# Patient Record
Sex: Female | Born: 1980 | Race: Black or African American | Hispanic: No | Marital: Single | Smoking: Current some day smoker
Health system: Southern US, Community
[De-identification: ages and names within clinical notes are randomized; demographics above are authoritative.]

## PROBLEM LIST (undated history)

## (undated) DIAGNOSIS — I82409 Acute embolism and thrombosis of unspecified deep veins of unspecified lower extremity: Secondary | ICD-10-CM

## (undated) DIAGNOSIS — N946 Dysmenorrhea, unspecified: Secondary | ICD-10-CM

## (undated) HISTORY — DX: Dysmenorrhea, unspecified: N94.6

---

## 2003-08-05 ENCOUNTER — Emergency Department (HOSPITAL_COMMUNITY): Admission: EM | Admit: 2003-08-05 | Discharge: 2003-08-06 | Payer: Self-pay | Admitting: *Deleted

## 2004-09-01 ENCOUNTER — Other Ambulatory Visit: Admission: RE | Admit: 2004-09-01 | Discharge: 2004-09-01 | Payer: Self-pay | Admitting: Obstetrics and Gynecology

## 2005-09-27 ENCOUNTER — Emergency Department (HOSPITAL_COMMUNITY): Admission: EM | Admit: 2005-09-27 | Discharge: 2005-09-27 | Payer: Self-pay | Admitting: Family Medicine

## 2006-03-24 ENCOUNTER — Other Ambulatory Visit: Admission: RE | Admit: 2006-03-24 | Discharge: 2006-03-24 | Payer: Self-pay | Admitting: Obstetrics & Gynecology

## 2007-09-24 ENCOUNTER — Other Ambulatory Visit: Admission: RE | Admit: 2007-09-24 | Discharge: 2007-09-24 | Payer: Self-pay | Admitting: Obstetrics & Gynecology

## 2020-07-03 DIAGNOSIS — Z1231 Encounter for screening mammogram for malignant neoplasm of breast: Secondary | ICD-10-CM | POA: Diagnosis not present

## 2021-06-02 DIAGNOSIS — Z136 Encounter for screening for cardiovascular disorders: Secondary | ICD-10-CM | POA: Diagnosis not present

## 2021-06-02 DIAGNOSIS — Z1331 Encounter for screening for depression: Secondary | ICD-10-CM | POA: Diagnosis not present

## 2021-06-02 DIAGNOSIS — N946 Dysmenorrhea, unspecified: Secondary | ICD-10-CM | POA: Diagnosis not present

## 2021-06-02 DIAGNOSIS — Z1159 Encounter for screening for other viral diseases: Secondary | ICD-10-CM | POA: Diagnosis not present

## 2021-06-02 DIAGNOSIS — I10 Essential (primary) hypertension: Secondary | ICD-10-CM | POA: Diagnosis not present

## 2021-06-02 DIAGNOSIS — Z113 Encounter for screening for infections with a predominantly sexual mode of transmission: Secondary | ICD-10-CM | POA: Diagnosis not present

## 2021-06-02 DIAGNOSIS — Z13220 Encounter for screening for lipoid disorders: Secondary | ICD-10-CM | POA: Diagnosis not present

## 2021-06-02 DIAGNOSIS — Z7689 Persons encountering health services in other specified circumstances: Secondary | ICD-10-CM | POA: Diagnosis not present

## 2021-06-02 DIAGNOSIS — Z0000 Encounter for general adult medical examination without abnormal findings: Secondary | ICD-10-CM | POA: Diagnosis not present

## 2021-06-02 DIAGNOSIS — Z23 Encounter for immunization: Secondary | ICD-10-CM | POA: Diagnosis not present

## 2021-06-02 DIAGNOSIS — Z130 Encounter for screening for diseases of the blood and blood-forming organs and certain disorders involving the immune mechanism: Secondary | ICD-10-CM | POA: Diagnosis not present

## 2021-07-09 DIAGNOSIS — Z1231 Encounter for screening mammogram for malignant neoplasm of breast: Secondary | ICD-10-CM | POA: Diagnosis not present

## 2021-07-12 DIAGNOSIS — N920 Excessive and frequent menstruation with regular cycle: Secondary | ICD-10-CM | POA: Diagnosis not present

## 2021-07-12 DIAGNOSIS — N946 Dysmenorrhea, unspecified: Secondary | ICD-10-CM | POA: Diagnosis not present

## 2021-07-12 DIAGNOSIS — D250 Submucous leiomyoma of uterus: Secondary | ICD-10-CM | POA: Diagnosis not present

## 2021-09-07 ENCOUNTER — Ambulatory Visit
Admission: EM | Admit: 2021-09-07 | Discharge: 2021-09-07 | Disposition: A | Discharge status: Home / Self Care | Payer: Federal, State, Local not specified - PPO

## 2021-09-07 DIAGNOSIS — M79662 Pain in left lower leg: Secondary | ICD-10-CM

## 2021-09-07 MED ORDER — KETOROLAC TROMETHAMINE 60 MG/2ML IM SOLN
30.0000 mg | Freq: Once | INTRAMUSCULAR | Status: AC
  Administered 2021-09-07: 30 mg via INTRAMUSCULAR

## 2021-09-07 MED ORDER — IBUPROFEN 800 MG PO TABS: 800.0000 mg | ORAL_TABLET | Freq: Three times a day (TID) | ORAL | 0 refills | Status: DC | PRN

## 2021-09-07 MED ORDER — CYCLOBENZAPRINE HCL 10 MG PO TABS: 10.0000 mg | ORAL_TABLET | Freq: Every day | ORAL | 0 refills | Status: DC

## 2021-09-07 MED ORDER — PREDNISONE 20 MG PO TABS: 40.0000 mg | ORAL_TABLET | Freq: Every day | ORAL | 0 refills | Status: DC

## 2021-09-07 NOTE — Discharge Instructions (Signed)
Your pain is most likely caused by irritation to the muscles or tendon.   Starting tomorrow take prednisone every morning with food for the next 5 days, this medicine is to help reduce inflammation that occurs with injury, you may use Tylenol 500 to 1000 mg in addition while taking this medication, once medication is complete you may use ibuprofen every 8 hours as needed  You may use muscle relaxer at bedtime for additional comfort, be mindful this medication will make you drowsy  You may use heating pad in 15 minute intervals as needed for additional comfort, or you may find comfort in using ice in 10-15 minutes over affected area  Begin stretching affected area daily for 10 minutes as tolerated to further loosen muscles   When lying down place pillow underneath leg and ankle for support  If pain persist after recommended treatment or reoccurs if may be beneficial to follow up with orthopedic specialist for evaluation, this doctor specializes in the bones and can manage your symptoms long-term with options such as but not limited to imaging, medications or physical therapy

## 2021-09-07 NOTE — ED Provider Notes (Addendum)
[facility]    CSN: [id number] Arrival date & time: 09/07/21  1325      History   Chief Complaint Chief Complaint  Patient presents with   Leg Pain    Left calf     HPI Debra Jacobs is a 41 y.o. female.   Patient presents with posterior left calf pain wrapping around to the ankle and plantar aspect of the foot for 4 days.  Endorses that she was cleaning her home prior to symptoms occurring.  Painful to bear weight.  Range of motion of ankle is intact but elicits pain .  Pain is described as a sharp, burning and throbbing sensation .  Has attempted use of RICE which has been somewhat helpful.  Endorses that she has had similar symptoms in the past initially occurring in 2019, endorses that she did a squat and heard a popping sensation in the calf.    History reviewed. No pertinent past medical history.  There are no problems to display for this patient.   History reviewed. No pertinent surgical history.  OB History   No obstetric history on file.      Home Medications    Prior to Admission medications   Medication Sig Start Date End Date Taking? Authorizing Provider  hydrochlorothiazide (HYDRODIURIL) 25 MG tablet Take 25 mg by mouth daily. 06/25/21  Yes [provider]  KARIVA 0.15-0.02/0.01 MG (21/5) tablet Take 1 tablet by mouth daily. 08/24/21  Yes [provider]    Family History History reviewed. No pertinent family history.  Social History Social History   Tobacco Use   Smoking status: Never   Smokeless tobacco: Never  Vaping Use   Vaping Use: Never used  Substance Use Topics   Alcohol use: Yes   Drug use: Never     Allergies   Erythromycin and Sulfa antibiotics   Review of Systems Review of Systems  Constitutional: Negative.   Respiratory: Negative.    Cardiovascular: Negative.   Musculoskeletal:  Positive for myalgias. Negative for arthralgias, back pain, gait problem, joint swelling, neck pain and neck  stiffness.  Skin: Negative.     Physical Exam Triage Vital Signs ED Triage Vitals  Enc Vitals Group     BP 09/07/21 1407 (!) 143/109     Pulse Rate 09/07/21 1407 97     Resp --      Temp 09/07/21 1407 98.3 F (36.8 C)     Temp Source 09/07/21 1407 Oral     SpO2 09/07/21 1407 100 %     Weight 09/07/21 1403 238 lb (108 kg)     Height 09/07/21 1403 '5\' 6"'$  (1.676 m)     Head Circumference --      Peak Flow --      Pain Score 09/07/21 1403 10     Pain Loc --      Pain Edu? --      Excl. in [place]? --    No data found.  Updated Vital Signs BP (!) 143/109 (BP Location: Left Arm)   Pulse 97   Temp 98.3 F (36.8 C) (Oral)   Ht '5\' 6"'$  (1.676 m)   Wt 238 lb (108 kg)   SpO2 100%   BMI 38.41 kg/m   Visual Acuity Right Eye Distance:   Left Eye Distance:   Bilateral Distance:    Right Eye Near:   Left Eye Near:    Bilateral Near:     Physical Exam Constitutional:  Appearance: Normal appearance.  HENT:     Head: Normocephalic.  Eyes:     Extraocular Movements: Extraocular movements intact.  Pulmonary:     Effort: Pulmonary effort is normal.  Musculoskeletal:     Comments: Tenderness in the left posterior calf directly where the gastrocnemius muscle meets the tendon, moderate swelling to the calf muscle present, no erythema or warmth to the skin noted, 2+ popliteal pulse  Mild to moderate swelling tenderness noted to the lateral aspects of the left ankle, no ecchymosis or deformity noted, range of motion intact, to bear weight  Unable to reproduce tenderness to the fascia, ball or calcaneus of the left foot, no swelling, ecchymosis or deformity of the foot noted, sensation intact, 2+ dorsalis pedis and pedal pulse  Skin:    General: Skin is warm and dry.  Neurological:     Mental Status: She is alert and oriented to person, place, and time. Mental status is at baseline.  Psychiatric:        Mood and Affect: Mood normal.        Behavior: Behavior normal.     UC  Treatments / Results  Labs (all labs ordered are listed, but only abnormal results are displayed) Labs Reviewed - No data to display  EKG   Radiology No results found.  Procedures Procedures (including critical care time)  Medications Ordered in UC Medications - No data to display  Initial Impression / Assessment and Plan / UC Course  I have reviewed the triage vital signs and the nursing notes.  Pertinent labs & imaging results that were available during my care of the patient were reviewed by me and considered in my medical decision making (see chart for details).  Pain in the left calf  Etiology is most likely a tendinitis, low suspicion of DVT as pain has been reoccurring intermittently throughout the years, lack of erythema and warmth to the skin, only history hypertension, discussed with patient, declined x-ray imaging today, discussed with patient that if symptoms continue to persist or recur that she will need imaging in the future to further assess injury, Toradol injection given in office, prescribed 40 mg prednisone burst, ibuprofen and Flexeril to be used outpatient, may continue RICE, patient had initially wrapped leg with Coban which visibly appears to be too tight leaving markings throughout the leg upon removal, discouraged use moving forward, rewrapped leg with Ace bandage, given walking referral to orthopedics if symptoms continue to persist for further evaluation and management Final Clinical Impressions(s) / UC Diagnoses   Final diagnoses:  None   Discharge Instructions   None    ED Prescriptions   None    PDMP not reviewed this encounter.   Hans Eden, NP 09/07/21 1458    Hans Eden, NP 09/07/21 1500

## 2021-09-07 NOTE — ED Triage Notes (Signed)
2018 - heard something POP in her calf -- she stated it healed.   Last summer -- squatted and re injured calf to ankle.   Injured again on Friday This time swelling, hurting, pain is more intense.

## 2021-09-15 DIAGNOSIS — M25572 Pain in left ankle and joints of left foot: Secondary | ICD-10-CM | POA: Diagnosis not present

## 2021-09-15 DIAGNOSIS — S86112A Strain of other muscle(s) and tendon(s) of posterior muscle group at lower leg level, left leg, initial encounter: Secondary | ICD-10-CM | POA: Diagnosis not present

## 2021-09-29 ENCOUNTER — Ambulatory Visit
Admission: RE | Admit: 2021-09-29 | Discharge: 2021-09-29 | Disposition: A | Discharge status: Home / Self Care | Payer: Federal, State, Local not specified - PPO | Source: Ambulatory Visit | Attending: Orthopedic Surgery | Admitting: Orthopedic Surgery

## 2021-09-29 ENCOUNTER — Other Ambulatory Visit: Payer: Self-pay | Admitting: Orthopedic Surgery

## 2021-09-29 DIAGNOSIS — M7989 Other specified soft tissue disorders: Secondary | ICD-10-CM | POA: Insufficient documentation

## 2021-09-29 DIAGNOSIS — I82432 Acute embolism and thrombosis of left popliteal vein: Secondary | ICD-10-CM | POA: Diagnosis not present

## 2021-09-29 DIAGNOSIS — M25572 Pain in left ankle and joints of left foot: Secondary | ICD-10-CM | POA: Diagnosis not present

## 2021-09-29 DIAGNOSIS — I824Z2 Acute embolism and thrombosis of unspecified deep veins of left distal lower extremity: Secondary | ICD-10-CM | POA: Diagnosis not present

## 2021-09-29 DIAGNOSIS — M79662 Pain in left lower leg: Secondary | ICD-10-CM | POA: Insufficient documentation

## 2021-09-29 DIAGNOSIS — R2242 Localized swelling, mass and lump, left lower limb: Secondary | ICD-10-CM | POA: Diagnosis not present

## 2021-09-29 DIAGNOSIS — I82412 Acute embolism and thrombosis of left femoral vein: Secondary | ICD-10-CM | POA: Diagnosis not present

## 2021-09-29 DIAGNOSIS — S86112A Strain of other muscle(s) and tendon(s) of posterior muscle group at lower leg level, left leg, initial encounter: Secondary | ICD-10-CM | POA: Diagnosis not present

## 2021-09-30 ENCOUNTER — Other Ambulatory Visit (INDEPENDENT_AMBULATORY_CARE_PROVIDER_SITE_OTHER): Payer: Self-pay | Admitting: *Deleted

## 2021-09-30 MED ORDER — IBUPROFEN 800 MG PO TABS: 800.0000 mg | ORAL_TABLET | Freq: Three times a day (TID) | ORAL | 0 refills | Status: DC | PRN

## 2021-10-01 ENCOUNTER — Encounter (INDEPENDENT_AMBULATORY_CARE_PROVIDER_SITE_OTHER): Payer: Self-pay | Admitting: Vascular Surgery

## 2021-10-05 ENCOUNTER — Encounter (INDEPENDENT_AMBULATORY_CARE_PROVIDER_SITE_OTHER): Payer: Self-pay | Admitting: Vascular Surgery

## 2021-10-05 ENCOUNTER — Telehealth (INDEPENDENT_AMBULATORY_CARE_PROVIDER_SITE_OTHER): Payer: Self-pay

## 2021-10-05 ENCOUNTER — Ambulatory Visit (INDEPENDENT_AMBULATORY_CARE_PROVIDER_SITE_OTHER): Payer: Federal, State, Local not specified - PPO | Admitting: Vascular Surgery

## 2021-10-05 DIAGNOSIS — I82409 Acute embolism and thrombosis of unspecified deep veins of unspecified lower extremity: Secondary | ICD-10-CM | POA: Insufficient documentation

## 2021-10-05 DIAGNOSIS — D649 Anemia, unspecified: Secondary | ICD-10-CM | POA: Insufficient documentation

## 2021-10-05 DIAGNOSIS — D259 Leiomyoma of uterus, unspecified: Secondary | ICD-10-CM | POA: Insufficient documentation

## 2021-10-05 DIAGNOSIS — I82412 Acute embolism and thrombosis of left femoral vein: Secondary | ICD-10-CM | POA: Diagnosis not present

## 2021-10-05 NOTE — Assessment & Plan Note (Signed)
This may be contributing to pelvic inflammation and creating a May-Thurner situation.  It also complicates the situation due to anemia and having to be cautious with anticoagulation.  Consideration for uterine artery embolization in the future can be given.

## 2021-10-05 NOTE — Progress Notes (Signed)
Patient ID: Debra Jacobs, female   DOB: [birth year], 41 y.o.   MRN: [id number]  Chief Complaint  Patient presents with   Establish Care    Referred by Dr Pricilla Riffle    HPI Debra Jacobs is a 41 y.o. female.  I am asked to see the patient by Dr. Pricilla Riffle for evaluation of extensive left lower extremity DVT.  About 5 weeks ago, the patient was diagnosed with a muscle pull or strain on the left leg due to Jacobs and swelling in that leg.  When things got worse and the swelling progressed over several weeks, she was referred for an ultrasound last week which I have reviewed.  This demonstrates thrombosis throughout the visualized veins of the left lower extremity up to the common femoral vein.  The patient does report multiple family members on both sides of the family who have had blood clot problems in the past.  She also reports about a year ago, having Jacobs and swelling in her lower leg that was never diagnosed but she wonders if that might have been a DVT.  She does also have fibroid issues and has some anemia from her fibroids.  They also cause her a lot of Jacobs.  The right leg has a very small amount of Jacobs and swelling in the lower leg.  The left leg is the predominantly affected leg.    Past Medical History Anemia Fibroids   No past surgical history on file.   Family History 2 paternal aunts had blood clots with complications leading to death in [place] Blood clots on her mother side of the family and multiple family members as well No bleeding disorders No aneurysms   Social History   Tobacco Use   Smoking status: Never   Smokeless tobacco: Never  Vaping Use   Vaping Use: Never used  Substance Use Topics   Alcohol use: Yes   Drug use: Never     Allergies  Allergen Reactions   Erythromycin Rash and Swelling    AND SWELLING     Sulfa Antibiotics Swelling    Current Outpatient Medications  Medication Sig Dispense Refill   ELIQUIS 5 MG TABS tablet Take 10 mg by  mouth 2 (two) times daily.     hydrochlorothiazide (HYDRODIURIL) 25 MG tablet Take 25 mg by mouth daily.     ibuprofen (ADVIL) 800 MG tablet Take 1 tablet (800 mg total) by mouth every 8 (eight) hours as needed. 45 tablet 0   KARIVA 0.15-0.02/0.01 MG (21/5) tablet Take 1 tablet by mouth daily.     cyclobenzaprine (FLEXERIL) 10 MG tablet Take 1 tablet (10 mg total) by mouth at bedtime. (Patient not taking: Reported on 10/05/2021) 10 tablet 0   predniSONE (DELTASONE) 20 MG tablet Take 2 tablets (40 mg total) by mouth daily. (Patient not taking: Reported on 10/05/2021) 10 tablet 0   No current facility-administered medications for this visit.      REVIEW OF SYSTEMS (Negative unless checked)  Constitutional: '[]'$ Weight loss  '[]'$ Fever  '[]'$ Chills Cardiac: '[]'$ Chest Jacobs   '[]'$ Chest pressure   '[]'$ Palpitations   '[]'$ Shortness of breath when laying flat   '[]'$ Shortness of breath at rest   '[]'$ Shortness of breath with exertion. Vascular:  '[x]'$ Jacobs in legs with walking   '[]'$ Jacobs in legs at rest   '[]'$ Jacobs in legs when laying flat   '[]'$ Claudication   '[]'$ Jacobs in feet when walking  '[]'$ Jacobs in feet at rest  '[]'$ Jacobs in feet when laying flat   [  x]History of DVT   '[x]'$ Phlebitis   '[x]'$ Swelling in legs   '[]'$ Varicose veins   '[]'$ Non-healing ulcers Pulmonary:   '[]'$ Uses home oxygen   '[]'$ Productive cough   '[]'$ Hemoptysis   '[]'$ Wheeze  '[]'$ COPD   '[]'$ Asthma Neurologic:  '[]'$ Dizziness  '[]'$ Blackouts   '[]'$ Seizures   '[]'$ History of stroke   '[]'$ History of TIA  '[]'$ Aphasia   '[]'$ Temporary blindness   '[]'$ Dysphagia   '[]'$ Weakness or numbness in arms   '[]'$ Weakness or numbness in legs Musculoskeletal:  '[]'$ Arthritis   '[]'$ Joint swelling   '[]'$ Joint Jacobs   '[]'$ Low back Jacobs Hematologic:  '[]'$ Easy bruising  '[]'$ Easy bleeding   '[]'$ Hypercoagulable state   '[x]'$ Anemic  '[]'$ Hepatitis Gastrointestinal:  '[]'$ Blood in stool   '[]'$ Vomiting blood  '[]'$ Gastroesophageal reflux/heartburn   '[]'$ Abdominal Jacobs Genitourinary:  '[]'$ Chronic kidney disease   '[]'$ Difficult urination  '[]'$ Frequent urination  '[]'$ Burning with urination    '[]'$ Hematuria Skin:  '[]'$ Rashes   '[]'$ Ulcers   '[]'$ Wounds Psychological:  '[]'$ History of anxiety   '[]'$  History of major depression.    Physical Exam BP (!) 160/103 (BP Location: Right Arm)   Pulse (!) 114   Resp 17   Ht '5\' 6"'$  (1.676 m)   Wt 237 lb (107.5 kg)   BMI 38.25 kg/m  Gen:  WD/WN, NAD Head: [place]/AT, No temporalis wasting.  Ear/Nose/Throat: Hearing grossly intact, nares w/o erythema or drainage, oropharynx w/o Erythema/Exudate Eyes: Conjunctiva clear, sclera non-icteric  Neck: trachea midline.  No JVD.  Pulmonary:  Good air movement, respirations not labored, no use of accessory muscles  Cardiac: RRR, no JVD Vascular:  Vessel Right Left  Radial Palpable Palpable                                   Gastrointestinal:. No masses, surgical incisions, or scars. Musculoskeletal: M/S 5/5 throughout.  Extremities without ischemic changes.  No deformity or atrophy.  2+ left lower extremity edema. Neurologic: Sensation grossly intact in extremities.  Symmetrical.  Speech is fluent. Motor exam as listed above. Psychiatric: Judgment intact, Mood & affect appropriate for pt's clinical situation. Dermatologic: No rashes or ulcers noted.  No cellulitis or open wounds.    Radiology US Venous Img Lower Unilateral Left (DVT)  Result Date: 09/29/2021 CLINICAL DATA:  Left lower extremity Jacobs and swelling EXAM: LEFT LOWER EXTREMITY VENOUS DOPPLER ULTRASOUND TECHNIQUE: Gray-scale sonography with compression, as well as color and duplex ultrasound, were performed to evaluate the deep venous system(s) from the level of the common femoral vein through the popliteal and proximal calf veins. COMPARISON:  None Available. FINDINGS: VENOUS Positive examination for deep venous thrombosis in the left lower extremity, with extensive thrombus present from the left common femoral vein extending through the imaged portions of the calf veins. Thrombus is occlusive along the length of the femoral vein, popliteal  vein, and profunda femoris. Limited views of the contralateral common femoral vein are unremarkable. OTHER None. Limitations: none IMPRESSION: 1. Positive examination for deep venous thrombosis in the left lower extremity, with extensive thrombus present from the left common femoral vein through the imaged portions of the calf veins. Thrombus is occlusive along the length of the femoral vein, popliteal vein, and profunda femoris. 2. Central extent of thrombus is not clearly established by ultrasound, and possibly extends into the pelvis. Consider CT venogram to further evaluate for central thrombus. These results will be called to the ordering clinician or representative by the Radiologist Assistant, and communication documented in the PACS or  [facility] Dashboard. Electronically Signed   By: Delanna Ahmadi M.D.   On: 09/29/2021 13:30    Labs No results found for this or any previous visit (from the past 2160 hour(s)).  Assessment/Plan:  DVT (deep venous thrombosis) ([place]) The patient has extensive left lower extremity DVT.  Although her symptoms have been now going on for several weeks, venogram with thrombectomy and intervention is still indicated due to the severity of the symptoms and the relatively high likelihood of May Thurner syndrome.  I have discussed the pathophysiology and natural history of DVT and venous thromboembolism with the patient today.  It sounds like she has a strong family history so remaining indefinitely on anticoagulation is likely part of the equation going forward.  I discussed the risks and benefits of the procedure.  She is agreeable to proceed we will get this scheduled ASAP  Fibroid uterus This may be contributing to pelvic inflammation and creating a May-Thurner situation.  It also complicates the situation due to anemia and having to be cautious with anticoagulation.  Consideration for uterine artery embolization in the future can be given.  Anemia We will need to monitor  for bleeding with her anticoagulation and procedure.      Debra Jacobs 10/05/2021, 3:45 PM   This note was created with Dragon medical transcription system.  Any errors from dictation are unintentional.

## 2021-10-05 NOTE — Assessment & Plan Note (Signed)
We will need to monitor for bleeding with her anticoagulation and procedure.

## 2021-10-05 NOTE — Assessment & Plan Note (Signed)
The patient has extensive left lower extremity DVT.  Although her symptoms have been now going on for several weeks, venogram with thrombectomy and intervention is still indicated due to the severity of the symptoms and the relatively high likelihood of May Thurner syndrome.  I have discussed the pathophysiology and natural history of DVT and venous thromboembolism with the patient today.  It sounds like she has a strong family history so remaining indefinitely on anticoagulation is likely part of the equation going forward.  I discussed the risks and benefits of the procedure.  She is agreeable to proceed we will get this scheduled ASAP

## 2021-10-05 NOTE — Telephone Encounter (Signed)
Spoke with the patient and she is scheduled with Dr. Lucky Cowboy on 10/07/21 with a 1:15 pm arrival time to the [facility] for a left leg thrombectomy. Pre-procedure instructions were discussed and patient stated she understood.

## 2021-10-07 ENCOUNTER — Encounter: Payer: Self-pay | Admitting: Vascular Surgery

## 2021-10-07 ENCOUNTER — Ambulatory Visit
Admission: RE | Admit: 2021-10-07 | Discharge: 2021-10-07 | Disposition: A | Discharge status: Home / Self Care | Payer: Federal, State, Local not specified - PPO | Attending: Vascular Surgery | Admitting: Vascular Surgery

## 2021-10-07 ENCOUNTER — Encounter
Admission: RE | Disposition: A | Discharge status: Home / Self Care | Payer: Self-pay | Source: Home / Self Care | Attending: Vascular Surgery

## 2021-10-07 DIAGNOSIS — I82412 Acute embolism and thrombosis of left femoral vein: Secondary | ICD-10-CM | POA: Insufficient documentation

## 2021-10-07 DIAGNOSIS — D259 Leiomyoma of uterus, unspecified: Secondary | ICD-10-CM | POA: Diagnosis not present

## 2021-10-07 DIAGNOSIS — I82622 Acute embolism and thrombosis of deep veins of left upper extremity: Secondary | ICD-10-CM

## 2021-10-07 DIAGNOSIS — I82512 Chronic embolism and thrombosis of left femoral vein: Secondary | ICD-10-CM | POA: Diagnosis not present

## 2021-10-07 DIAGNOSIS — D649 Anemia, unspecified: Secondary | ICD-10-CM | POA: Insufficient documentation

## 2021-10-07 HISTORY — PX: PERIPHERAL VASCULAR THROMBECTOMY: CATH118306

## 2021-10-07 HISTORY — DX: Acute embolism and thrombosis of unspecified deep veins of unspecified lower extremity: I82.409

## 2021-10-07 LAB — BUN: BUN: 8 mg/dL (ref 6–20)

## 2021-10-07 LAB — CREATININE, SERUM
Creatinine, Ser: 0.72 mg/dL (ref 0.44–1.00)
GFR, Estimated: >60 mL/min (ref >60)

## 2021-10-07 SURGERY — PERIPHERAL VASCULAR THROMBECTOMY
Anesthesia: Moderate Sedation | Laterality: Left

## 2021-10-07 MED ORDER — MIDAZOLAM HCL 5 MG/5ML IJ SOLN
INTRAMUSCULAR | Status: AC
  Filled 2021-10-07: qty 5

## 2021-10-07 MED ORDER — MIDAZOLAM HCL 2 MG/2ML IJ SOLN
INTRAMUSCULAR | Status: DC | PRN
  Administered 2021-10-07 (×5): 1 mg via INTRAVENOUS

## 2021-10-07 MED ORDER — FENTANYL CITRATE (PF) 100 MCG/2ML IJ SOLN
INTRAMUSCULAR | Status: DC | PRN
Start: 2021-10-07 — End: 2021-10-07
  Administered 2021-10-07: 50 ug via INTRAVENOUS
  Administered 2021-10-07: 25 ug via INTRAVENOUS
  Administered 2021-10-07 (×2): 50 ug via INTRAVENOUS
  Administered 2021-10-07: 25 ug via INTRAVENOUS

## 2021-10-07 MED ORDER — FENTANYL CITRATE (PF) 100 MCG/2ML IJ SOLN
INTRAMUSCULAR | Status: AC
  Filled 2021-10-07: qty 2

## 2021-10-07 MED ORDER — DIPHENHYDRAMINE HCL 50 MG/ML IJ SOLN: 50.0000 mg | Freq: Once | INTRAMUSCULAR | Status: DC | PRN

## 2021-10-07 MED ORDER — CEFAZOLIN SODIUM-DEXTROSE 2-4 GM/100ML-% IV SOLN
INTRAVENOUS | Status: AC
  Administered 2021-10-07: 2 g via INTRAVENOUS
  Filled 2021-10-07: qty 100

## 2021-10-07 MED ORDER — FAMOTIDINE 20 MG PO TABS: 40.0000 mg | ORAL_TABLET | Freq: Once | ORAL | Status: DC | PRN

## 2021-10-07 MED ORDER — CEFAZOLIN SODIUM-DEXTROSE 2-4 GM/100ML-% IV SOLN: 2.0000 g | INTRAVENOUS | Status: AC

## 2021-10-07 MED ORDER — HYDROMORPHONE HCL 1 MG/ML IJ SOLN: 1.0000 mg | Freq: Once | INTRAMUSCULAR | Status: DC | PRN

## 2021-10-07 MED ORDER — SODIUM CHLORIDE 0.9 % IV SOLN: INTRAVENOUS | Status: DC

## 2021-10-07 MED ORDER — ONDANSETRON HCL 4 MG/2ML IJ SOLN: 4.0000 mg | Freq: Four times a day (QID) | INTRAMUSCULAR | Status: DC | PRN

## 2021-10-07 MED ORDER — METHYLPREDNISOLONE SODIUM SUCC 125 MG IJ SOLR: 125.0000 mg | Freq: Once | INTRAMUSCULAR | Status: DC | PRN

## 2021-10-07 MED ORDER — IODIXANOL 320 MG/ML IV SOLN
INTRAVENOUS | Status: DC | PRN
  Administered 2021-10-07: 30 mL

## 2021-10-07 MED ORDER — MIDAZOLAM HCL 2 MG/ML PO SYRP: 8.0000 mg | ORAL_SOLUTION | Freq: Once | ORAL | Status: DC | PRN

## 2021-10-07 MED ORDER — ALTEPLASE 2 MG IJ SOLR
INTRAMUSCULAR | Status: AC
  Filled 2021-10-07: qty 8

## 2021-10-07 MED ORDER — HEPARIN SODIUM (PORCINE) 1000 UNIT/ML IJ SOLN
INTRAMUSCULAR | Status: AC
  Filled 2021-10-07: qty 10

## 2021-10-07 SURGICAL SUPPLY — 12 items
CANISTER PENUMBRA ENGINE (MISCELLANEOUS) ×1 IMPLANT
CANNULA 5F STIFF (CANNULA) ×1 IMPLANT
CATH BEACON 5 .035 65 KMP TIP (CATHETERS) ×1 IMPLANT
CATH LIGHTNI FLASH 16XTORQ 100 (CATHETERS) IMPLANT
CATH LIGHTNING FLASH XTORQ 100 (CATHETERS) ×2
COVER PROBE U/S 5X48 (MISCELLANEOUS) ×1 IMPLANT
DRYSEAL FLEXSHEATH 16FR 33CM (SHEATH) ×1
GLIDEWIRE ADV .035X180CM (WIRE) ×1 IMPLANT
PACK ANGIOGRAPHY (CUSTOM PROCEDURE TRAY) ×2 IMPLANT
SHEATH DRYSEAL FLEX 16FR 33CM (SHEATH) IMPLANT
SHEATH PINNACLE 11FRX10 (SHEATH) ×1 IMPLANT
WIRE GUIDERIGHT .035X150 (WIRE) ×1 IMPLANT

## 2021-10-11 ENCOUNTER — Encounter: Payer: Self-pay | Admitting: Vascular Surgery

## 2021-10-11 ENCOUNTER — Telehealth (INDEPENDENT_AMBULATORY_CARE_PROVIDER_SITE_OTHER): Payer: Self-pay

## 2021-10-25 ENCOUNTER — Telehealth (INDEPENDENT_AMBULATORY_CARE_PROVIDER_SITE_OTHER): Payer: Self-pay | Admitting: Vascular Surgery

## 2021-10-25 NOTE — Telephone Encounter (Signed)
Pt. Has an appointment on 11/01/21 but will be out of Eliquis 5 MG Tabs tablet and will need a refill before Thursday 10/28/21.  Please have prescription sent to [facility] [address] in [place]. Please advise.

## 2021-10-26 ENCOUNTER — Other Ambulatory Visit (INDEPENDENT_AMBULATORY_CARE_PROVIDER_SITE_OTHER): Payer: Self-pay

## 2021-10-26 MED ORDER — ELIQUIS 5 MG PO TABS: 10.0000 mg | ORAL_TABLET | Freq: Two times a day (BID) | ORAL | 0 refills | Status: DC

## 2021-10-26 NOTE — Telephone Encounter (Signed)
Refill sent to [facility] [address] [place]

## 2021-10-29 ENCOUNTER — Other Ambulatory Visit (INDEPENDENT_AMBULATORY_CARE_PROVIDER_SITE_OTHER): Payer: Self-pay | Admitting: Vascular Surgery

## 2021-10-29 DIAGNOSIS — I82412 Acute embolism and thrombosis of left femoral vein: Secondary | ICD-10-CM

## 2021-11-01 ENCOUNTER — Encounter (INDEPENDENT_AMBULATORY_CARE_PROVIDER_SITE_OTHER): Payer: Self-pay | Admitting: Nurse Practitioner

## 2021-11-01 ENCOUNTER — Ambulatory Visit (INDEPENDENT_AMBULATORY_CARE_PROVIDER_SITE_OTHER): Payer: Federal, State, Local not specified - PPO | Admitting: Nurse Practitioner

## 2021-11-01 ENCOUNTER — Ambulatory Visit (INDEPENDENT_AMBULATORY_CARE_PROVIDER_SITE_OTHER): Payer: Federal, State, Local not specified - PPO

## 2021-11-01 VITALS — BP 143/91 | HR 93 | Resp 17 | Ht 66.0 in | Wt 236.0 lb

## 2021-11-01 DIAGNOSIS — I82412 Acute embolism and thrombosis of left femoral vein: Secondary | ICD-10-CM | POA: Diagnosis not present

## 2021-11-01 MED ORDER — ELIQUIS 5 MG PO TABS: 5.0000 mg | ORAL_TABLET | Freq: Two times a day (BID) | ORAL | 6 refills | Status: DC

## 2021-11-02 ENCOUNTER — Encounter (INDEPENDENT_AMBULATORY_CARE_PROVIDER_SITE_OTHER): Payer: Self-pay | Admitting: Nurse Practitioner

## 2021-11-02 NOTE — Progress Notes (Signed)
Subjective:    Patient ID: Debra Jacobs, female    DOB: 1981-04-05, 41 y.o.   MRN: [id number] No chief complaint on file.   Debra Jacobs is a 41 y.o. female.  She returns today following recent thrombectomy of her left lower extremity.  Initially the patient was felt to pulled muscle or strain to the pain and swelling in that leg.  As it progressed she was referred for noninvasive studies to evaluate for possible DVT.  The patient had noted DVT throughout the left lower extremity up to the common femoral vein.  The patient notes that she has a family history of blood clots on both sides of family.  The patient also remains on estrogen-based oral contraceptive.  Today the patient notes that the swelling is much improved.  There is only some in her ankle area.  She is understandably anxious about the possibility of recurrent or worsening DVT.  Today noninvasive studies show continued thrombus in the left lower extremity however this thrombus has recannulization from the previous occlusion.  The findings are improved from previous studies.    Review of Systems  Cardiovascular:  Positive for leg swelling.  All other systems reviewed and are negative.      Objective:   Physical Exam Vitals reviewed.  HENT:     Head: Normocephalic.  Cardiovascular:     Rate and Rhythm: Normal rate.     Pulses: Normal pulses.  Pulmonary:     Effort: Pulmonary effort is normal.  Musculoskeletal:     Left lower leg: Edema present.  Skin:    General: Skin is warm and dry.  Neurological:     Mental Status: She is alert and oriented to person, place, and time.  Psychiatric:        Mood and Affect: Mood normal.        Behavior: Behavior normal.        Thought Content: Thought content normal.        Judgment: Judgment normal.     BP (!) 143/91 (BP Location: Right Arm)   Pulse 93   Resp 17   Ht '5\' 6"'$  (1.676 m)   Wt 236 lb (107 kg)   LMP 10/07/2021   BMI 38.09 kg/m   Past Medical  History:  Diagnosis Date   DVT (deep venous thrombosis) (HCC)     Social History   Socioeconomic History   Marital status: Single    Spouse name: Not on file   Number of children: Not on file   Years of education: Not on file   Highest education level: Not on file  Occupational History   Not on file  Tobacco Use   Smoking status: Never   Smokeless tobacco: Never  Vaping Use   Vaping Use: Former  Substance and Sexual Activity   Alcohol use: Yes    Comment: rare   Drug use: Never   Sexual activity: Not on file  Other Topics Concern   Not on file  Social History Narrative   Not on file   Social Determinants of Health   Financial Resource Strain: Not on file  Food Insecurity: Not on file  Transportation Needs: Not on file  Physical Activity: Not on file  Stress: Not on file  Social Connections: Not on file  Intimate Partner Violence: Not on file    Past Surgical History:  Procedure Laterality Date   PERIPHERAL VASCULAR THROMBECTOMY Left 10/07/2021   Procedure: PERIPHERAL VASCULAR THROMBECTOMY;  Surgeon: Lucky Cowboy,  Erskine Squibb, MD;  Location: [facility];  Service: Cardiovascular;  Laterality: Left;    History reviewed. No pertinent family history.  Allergies  Allergen Reactions   Etonogestrel-Ethinyl Estradiol    Erythromycin Rash and Swelling    AND SWELLING     Sulfa Antibiotics Swelling        No data to display            CMP     Component Value Date/Time   BUN 8 10/07/2021 1325   CREATININE 0.72 10/07/2021 1325   GFRNONAA >60 10/07/2021 1325     No results found.     Assessment & Plan:   1. Acute deep vein thrombosis (DVT) of femoral vein of left lower extremity (HCC) We had a long discussion regarding the pathophysiology and progression of DVT.  In this instance the patient's DVT has progressed from acute to chronic.  There has been no propagation.  The patient's DVT is currently stable.  Based on this no intervention is currently  recommended.  The patient was still on her loading dose of 2 mg twice daily for Eliquis.  We discussed that higher doses of oral anticoagulation do not equate to better treatment results.  Patient is advised to resume on 5 mg twice daily.  Given the patient's history on estrogen containing birth control, it is advised that she discuss with OBGYN alternatives as this may have played a contributing part.  The patient also has a family history of DVT, she is also referred to hematology for possible hypercoagulable work-up.  Patient is advised to continue with conservative therapy including use of medical grade compression, elevation and activity.  With the patient return in 6 months or sooner if issues arise. - Ambulatory referral to Hematology / Oncology   Current Outpatient Medications on File Prior to Visit  Medication Sig Dispense Refill   hydrochlorothiazide (HYDRODIURIL) 25 MG tablet Take 25 mg by mouth daily.     ibuprofen (ADVIL) 800 MG tablet Take 1 tablet (800 mg total) by mouth every 8 (eight) hours as needed. 45 tablet 0   KARIVA 0.15-0.02/0.01 MG (21/5) tablet Take 1 tablet by mouth daily.     cyclobenzaprine (FLEXERIL) 10 MG tablet Take 1 tablet (10 mg total) by mouth at bedtime. (Patient not taking: Reported on 10/05/2021) 10 tablet 0   predniSONE (DELTASONE) 20 MG tablet Take 2 tablets (40 mg total) by mouth daily. (Patient not taking: Reported on 10/05/2021) 10 tablet 0   No current facility-administered medications on file prior to visit.    There are no Patient Instructions on file for this visit. No follow-ups on file.   Kris Hartmann, NP

## 2021-11-09 DIAGNOSIS — D250 Submucous leiomyoma of uterus: Secondary | ICD-10-CM | POA: Diagnosis not present

## 2021-11-09 DIAGNOSIS — I825Y2 Chronic embolism and thrombosis of unspecified deep veins of left proximal lower extremity: Secondary | ICD-10-CM | POA: Diagnosis not present

## 2021-11-09 DIAGNOSIS — N946 Dysmenorrhea, unspecified: Secondary | ICD-10-CM | POA: Diagnosis not present

## 2021-11-09 DIAGNOSIS — I10 Essential (primary) hypertension: Secondary | ICD-10-CM | POA: Diagnosis not present

## 2021-11-10 ENCOUNTER — Encounter: Payer: Self-pay | Admitting: Oncology

## 2021-11-10 ENCOUNTER — Inpatient Hospital Stay: Payer: Federal, State, Local not specified - PPO | Attending: Oncology | Admitting: Oncology

## 2021-11-10 ENCOUNTER — Inpatient Hospital Stay: Payer: Federal, State, Local not specified - PPO

## 2021-11-10 VITALS — BP 139/93 | HR 108 | Temp 97.6°F | Resp 18 | Wt 239.8 lb

## 2021-11-10 DIAGNOSIS — D259 Leiomyoma of uterus, unspecified: Secondary | ICD-10-CM | POA: Diagnosis not present

## 2021-11-10 DIAGNOSIS — Z7952 Long term (current) use of systemic steroids: Secondary | ICD-10-CM | POA: Insufficient documentation

## 2021-11-10 DIAGNOSIS — D500 Iron deficiency anemia secondary to blood loss (chronic): Secondary | ICD-10-CM | POA: Diagnosis not present

## 2021-11-10 DIAGNOSIS — Z7901 Long term (current) use of anticoagulants: Secondary | ICD-10-CM | POA: Insufficient documentation

## 2021-11-10 DIAGNOSIS — I82462 Acute embolism and thrombosis of left calf muscular vein: Secondary | ICD-10-CM | POA: Diagnosis not present

## 2021-11-10 DIAGNOSIS — F17290 Nicotine dependence, other tobacco product, uncomplicated: Secondary | ICD-10-CM | POA: Diagnosis not present

## 2021-11-10 DIAGNOSIS — Z79899 Other long term (current) drug therapy: Secondary | ICD-10-CM | POA: Insufficient documentation

## 2021-11-10 DIAGNOSIS — N920 Excessive and frequent menstruation with regular cycle: Secondary | ICD-10-CM | POA: Diagnosis not present

## 2021-11-10 DIAGNOSIS — D649 Anemia, unspecified: Secondary | ICD-10-CM

## 2021-11-10 LAB — TECHNOLOGIST SMEAR REVIEW: Plt Morphology: ADEQUATE

## 2021-11-10 LAB — COMPREHENSIVE METABOLIC PANEL
ALT: 16 U/L (ref 0–44)
AST: 22 U/L (ref 15–41)
Albumin: 3.3 g/dL — ABNORMAL LOW (ref 3.5–5.0)
Alkaline Phosphatase: 66 U/L (ref 38–126)
Anion gap: 4 — ABNORMAL LOW (ref 5–15)
BUN: 11 mg/dL (ref 6–20)
CO2: 26 mmol/L (ref 22–32)
Calcium: 8.5 mg/dL — ABNORMAL LOW (ref 8.9–10.3)
Chloride: 104 mmol/L (ref 98–111)
Creatinine, Ser: 0.64 mg/dL (ref 0.44–1.00)
GFR, Estimated: >60 mL/min (ref >60)
Glucose, Bld: 104 mg/dL — ABNORMAL HIGH (ref 70–99)
Potassium: 3.1 mmol/L — ABNORMAL LOW (ref 3.5–5.1)
Sodium: 134 mmol/L — ABNORMAL LOW (ref 135–145)
Total Bilirubin: 0.3 mg/dL (ref 0.3–1.2)
Total Protein: 7.3 g/dL (ref 6.5–8.1)

## 2021-11-10 LAB — CBC WITH DIFFERENTIAL/PLATELET
Abs Immature Granulocytes: 0.04 10*3/uL (ref 0.00–0.07)
Basophils Absolute: 0 10*3/uL (ref 0.0–0.1)
Basophils Relative: 0 %
Eosinophils Absolute: 0.1 10*3/uL (ref 0.0–0.5)
Eosinophils Relative: 1 %
HCT: 22.2 % — ABNORMAL LOW (ref 36.0–46.0)
Hemoglobin: 6.1 g/dL — ABNORMAL LOW (ref 12.0–15.0)
Immature Granulocytes: 0 %
Lymphocytes Relative: 18 %
Lymphs Abs: 2 10*3/uL (ref 0.7–4.0)
MCH: 20.1 pg — ABNORMAL LOW (ref 26.0–34.0)
MCHC: 27.5 g/dL — ABNORMAL LOW (ref 30.0–36.0)
MCV: 73 fL — ABNORMAL LOW (ref 80.0–100.0)
Monocytes Absolute: 1.1 10*3/uL — ABNORMAL HIGH (ref 0.1–1.0)
Monocytes Relative: 11 %
Neutro Abs: 7.3 10*3/uL (ref 1.7–7.7)
Neutrophils Relative %: 70 %
Platelets: 386 10*3/uL (ref 150–400)
RBC: 3.04 MIL/uL — ABNORMAL LOW (ref 3.87–5.11)
RDW: 19.2 % — ABNORMAL HIGH (ref 11.5–15.5)
WBC: 10.6 10*3/uL — ABNORMAL HIGH (ref 4.0–10.5)
nRBC: 0 % (ref 0.0–0.2)

## 2021-11-10 LAB — IRON AND TIBC
Iron: 17 ug/dL — ABNORMAL LOW (ref 28–170)
Saturation Ratios: 3 % — ABNORMAL LOW (ref 10.4–31.8)
TIBC: 510 ug/dL — ABNORMAL HIGH (ref 250–450)
UIBC: 493 ug/dL

## 2021-11-10 LAB — ANTITHROMBIN III: AntiThromb III Func: 94 % (ref 75–120)

## 2021-11-10 LAB — FERRITIN: Ferritin: 5 ng/mL — ABNORMAL LOW (ref 11–307)

## 2021-11-10 NOTE — Progress Notes (Signed)
Patient here to establish care for DVT

## 2021-11-10 NOTE — Progress Notes (Unsigned)
Hematology/Oncology Consult  note [phone] [phone]     REFERRING PROVIDER: Kris Hartmann, NP   Patient Care Team: Zeb Comfort, MD as PCP - General (Family Medicine) Earlie Server, MD as Consulting Physician (Oncology)  ASSESSMENT & PLAN:   DVT (deep venous thrombosis) ([place]) Provoked extensive lower extremity DVT due to OCP and fibroid uterus.  Agree with continue anticoagulation, duration 3-6 months.  Check hypercoagulable work up due to age of onset.   Fibroid uterus Advise patient to follow up with gyn for management.  Embolization, ablation vs surgery.    Anemia Hb <7, recommend PRBC transfusion.    Iron deficiency anemia due to chronic blood loss Symptomatic anemia, recommend 1 unit of PRBC transfusion.  Severe Iron deficiency anemia,  Recommend IV venofer '200mg'$  x 5 doses option of IV Venofer treatment discussed  I discussed about the potential risks including but not limited to allergic reactions/infusion reactions including anaphylactic reactions, phlebitis, high blood pressure, wheezing, SOB, skin rash, weight gain, leg swelling, headache, nausea and fatigue, etc. Patient tolerates oral iron supplement poorly and desires to achieved higher level of iron faster for adequate hematopoesis. Patient is not sexually active and denies any chance of pregnancy. She knows to update me if any changes.    Orders Placed This Encounter  Procedures   Ferritin    Standing Status:   Future    Number of Occurrences:   1    Standing Expiration Date:   05/13/2022   Iron and TIBC    Standing Status:   Future    Number of Occurrences:   1    Standing Expiration Date:   11/11/2022   CBC with Differential/Platelet    Standing Status:   Future    Number of Occurrences:   1    Standing Expiration Date:   11/11/2022   Comprehensive metabolic panel    Standing Status:   Future    Number of Occurrences:   1    Standing Expiration Date:   11/11/2022    ANTIPHOSPHOLIPID SYNDROME PROF    Standing Status:   Future    Number of Occurrences:   1    Standing Expiration Date:   11/11/2022   Factor 5 leiden    Standing Status:   Future    Number of Occurrences:   1    Standing Expiration Date:   11/11/2022   Prothrombin gene mutation    Standing Status:   Future    Number of Occurrences:   1    Standing Expiration Date:   11/11/2022   Protein S, total and free    Standing Status:   Future    Number of Occurrences:   1    Standing Expiration Date:   11/11/2022   Antithrombin III    Standing Status:   Future    Number of Occurrences:   1    Standing Expiration Date:   11/11/2022   Protein C activity    Standing Status:   Future    Number of Occurrences:   1    Standing Expiration Date:   11/11/2022   Beta-2-glycoprotein i abs, IgG/M/A    Standing Status:   Future    Number of Occurrences:   1    Standing Expiration Date:   11/11/2022   Technologist smear review    Standing Status:   Future    Number of Occurrences:   1    Standing Expiration Date:   11/11/2022  Order Specific Question:   Clinical information:    Answer:   acute DVT   Follow up in 3 months    All questions were answered. The patient knows to call the clinic with any problems, questions or concerns. No barriers to learning was detected.  Earlie Server, MD 11/10/2021   CHIEF COMPLAINTS/PURPOSE OF CONSULTATION:  Acute lower extremity DVT, anemia  HISTORY OF PRESENTING ILLNESS:  Debra Jacobs 41 y.o. female present to establish care for acute lower extremity DVT, anemia.  Patient has fibroid uterus and was recommended by gynecology to start OCP in February/March 2023. 09/07/2021, patient presented emergency room for evaluation of left calf pain.  She was diagnosed with tendinitis.  Patient was referred to see orthopedic surgeon.  Patient was recommended to wear boot 09/29/2021, patient was seen by orthopedic surgeon again.  Patient has had persistent pain and swelling to  the left lower extremity.  09/29/2021 ultrasound left lower extremity was obtained and showed extensive thrombus present from the left common femoral vein through the imaged portion of the calf veins.  Thrombus is occlusive along the length of the femoral vein, popliteal vein, and a profunda hemolysis.  Tecentriq stent of the thrombus is not clearly established by ultrasound and possibly extends to the pelvis. Patient was started on Eliquis for anticoagulation. 10/05/2021, patient was seen by vascular surgeon Dr. Lucky Cowboy .  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  10/07/21 patient underwent catheter directed thrombolysis and mechanical thrombectomy.  IVC was placed.  Patient tolerates anticoagulation with Eliquis.  Lower extremity swelling improved after procedure.  She wears compression stocking. + Heavy menstrual period due to fibroid uterus.  She is anemic. 05/23/2021, CBC showed hemoglobin of 9.5.  MCV 74. +Fatigue, tired.  Shortness of breath with exertion.  MEDICAL HISTORY:  Past Medical History:  Diagnosis Date   DVT (deep venous thrombosis) ([place])    Dysmenorrhea     SURGICAL HISTORY: Past Surgical History:  Procedure Laterality Date   PERIPHERAL VASCULAR THROMBECTOMY Left 10/07/2021   Procedure: PERIPHERAL VASCULAR THROMBECTOMY;  Surgeon: Algernon Huxley, MD;  Location: [facility];  Service: Cardiovascular;  Laterality: Left;    SOCIAL HISTORY: Social History   Socioeconomic History   Marital status: Single    Spouse name: Not on file   Number of children: Not on file   Years of education: Not on file   Highest education level: Not on file  Occupational History   Not on file  Tobacco Use   Smoking status: Some Days   Smokeless tobacco: Never   Tobacco comments:    Pt smokes Hookah  Vaping Use   Vaping Use: Former  Substance and Sexual Activity   Alcohol use: Not Currently    Comment: rare    Drug use: Never   Sexual activity: Not on file  Other Topics Concern   Not on file  Social History Narrative   Not on file   Social Determinants of Health   Financial Resource Strain: Not on file  Food Insecurity: Not on file  Transportation Needs: Not on file  Physical Activity: Not on file  Stress: Not on file  Social Connections: Not on file  Intimate Partner Violence: Not on file    FAMILY HISTORY: Family History  Problem Relation Age of Onset   Hypertension Mother    Hypertension Father    Nephrolithiasis Father    Hypertension Brother  Multiple sclerosis Brother    Deep vein thrombosis Maternal Aunt    Liver disease Maternal Grandfather     ALLERGIES:  is allergic to etonogestrel-ethinyl estradiol, erythromycin, and sulfa antibiotics.  MEDICATIONS:  Current Outpatient Medications  Medication Sig Dispense Refill   ELIQUIS 5 MG TABS tablet Take 1 tablet (5 mg total) by mouth 2 (two) times daily. 60 tablet 6   hydrochlorothiazide (HYDRODIURIL) 25 MG tablet Take 25 mg by mouth daily.     ibuprofen (ADVIL) 800 MG tablet Take 1 tablet (800 mg total) by mouth every 8 (eight) hours as needed. 45 tablet 0   KARIVA 0.15-0.02/0.01 MG (21/5) tablet Take 1 tablet by mouth daily. (Patient not taking: Reported on 11/10/2021)     predniSONE (DELTASONE) 20 MG tablet Take 2 tablets (40 mg total) by mouth daily. (Patient not taking: Reported on 10/05/2021) 10 tablet 0   No current facility-administered medications for this visit.    Review of Systems  Constitutional:  Positive for fatigue. Negative for appetite change, chills and fever.  HENT:   Negative for hearing loss and voice change.   Eyes:  Negative for eye problems.  Respiratory:  Negative for chest tightness and cough.   Cardiovascular:  Negative for chest pain.  Gastrointestinal:  Negative for abdominal distention, abdominal pain and blood in stool.  Endocrine: Negative for hot flashes.  Genitourinary:  Positive for  vaginal bleeding. Negative for difficulty urinating and frequency.   Musculoskeletal:  Negative for arthralgias.  Skin:  Negative for itching and rash.  Neurological:  Negative for extremity weakness.  Hematological:  Negative for adenopathy.  Psychiatric/Behavioral:  Negative for confusion.      PHYSICAL EXAMINATION: ECOG PERFORMANCE STATUS: 1 - Symptomatic but completely ambulatory  Vitals:   11/10/21 1456  BP: (!) 139/93  Pulse: (!) 108  Resp: 18  Temp: 97.6 F (36.4 C)   Filed Weights   11/10/21 1456  Weight: 239 lb 12.8 oz (108.8 kg)    Physical Exam Constitutional:      General: She is not in acute distress.    Appearance: She is obese. She is not diaphoretic.  HENT:     Head: Normocephalic and atraumatic.     Nose: Nose normal.     Mouth/Throat:     Pharynx: No oropharyngeal exudate.  Eyes:     General: No scleral icterus.    Pupils: Pupils are equal, round, and reactive to light.  Cardiovascular:     Rate and Rhythm: Normal rate and regular rhythm.     Heart sounds: No murmur heard. Pulmonary:     Effort: Pulmonary effort is normal. No respiratory distress.     Breath sounds: No rales.  Chest:     Chest wall: No tenderness.  Abdominal:     General: There is no distension.     Palpations: Abdomen is soft.     Tenderness: There is no abdominal tenderness.  Musculoskeletal:        General: Normal range of motion.     Cervical back: Normal range of motion and neck supple.     Comments: She wears compression stock bilaterally  Skin:    General: Skin is warm and dry.     Findings: No erythema.  Neurological:     Mental Status: She is alert and oriented to person, place, and time.     Cranial Nerves: No cranial nerve deficit.     Motor: No abnormal muscle tone.     Coordination: Coordination normal.  Psychiatric:        Mood and Affect: Affect normal.      LABORATORY DATA:  I have reviewed the data as listed Lab Results  Component Value Date    WBC 10.6 (H) 11/10/2021   HGB 6.1 (L) 11/10/2021   HCT 22.2 (L) 11/10/2021   MCV 73.0 (L) 11/10/2021   PLT 386 11/10/2021   Recent Labs    10/07/21 1325 11/10/21 1543  NA  --  134*  K  --  3.1*  CL  --  104  CO2  --  26  GLUCOSE  --  104*  BUN 8 11  CREATININE 0.72 0.64  CALCIUM  --  8.5*  GFRNONAA >60 >60  PROT  --  7.3  ALBUMIN  --  3.3*  AST  --  22  ALT  --  16  ALKPHOS  --  66  BILITOT  --  0.3    RADIOGRAPHIC STUDIES: I have personally reviewed the radiological images as listed and agreed with the findings in the report. VAS [place] LOWER EXTREMITY VENOUS (DVT)  Result Date: 11/04/2021  Lower Venous DVT Study Patient Name:  IMOGINE CARVELL  Date of Exam:   11/01/2021 Medical Rec #: [id number]         Accession #:    [id number] Date of Birth: May 21, 1980          Patient Gender: F Patient Age:   89 years Exam Location:  [facility] Procedure:      VAS [place] LOWER EXTREMITY VENOUS (DVT) Referring Phys: Leotis Pain --------------------------------------------------------------------------------  Risk Factors: Surgery 10/07/2021 Catheter directed thrombolysis with 8 mg of tPA to the left common femoral and femoral vein. Mechanical thrombectomy to the left popliteal vein, superficial femoral vein, and common femoral vein with the penumbra 16 lightening/flash device. Comparison Study: Prior duplex 09/29/2021 suggested deep venous thrombosis in                   the left lower extremity, with extensive thrombus present from                   the left common femoral vein extending through the imaged                   portions of the calf veins. Thrombus is occlusive along the                   length of the femoral vein, popliteal vein, and profunda                   femoris. Performing Technologist: Delorise Shiner RVT  Examination Guidelines: A complete evaluation includes B-mode imaging, spectral Doppler, color Doppler, and power Doppler as needed of all accessible portions of each  vessel. Bilateral testing is considered an integral part of a complete examination. Limited examinations for reoccurring indications may be performed as noted. The reflux portion of the exam is performed with the patient in reverse Trendelenburg.  +-----+---------------+---------+-----------+----------+--------------+ RIGHTCompressibilityPhasicitySpontaneityPropertiesThrombus Aging +-----+---------------+---------+-----------+----------+--------------+ CFV  Full           Yes      Yes                                 +-----+---------------+---------+-----------+----------+--------------+   +---------+---------------+---------+-----------+---------------+--------------+ LEFT     CompressibilityPhasicitySpontaneityProperties     Thrombus Aging +---------+---------------+---------+-----------+---------------+--------------+ CFV      Partial  Yes      Yes        softly         Age                                                        echogenic      Indeterminate  +---------+---------------+---------+-----------+---------------+--------------+ SFJ      Full           Yes      Yes                                      +---------+---------------+---------+-----------+---------------+--------------+ FV Prox  Partial        No       Yes        partially                                                                 re-cannalized                 +---------+---------------+---------+-----------+---------------+--------------+ FV Mid   Partial        No       Yes        partially                                                                 re-cannalized                 +---------+---------------+---------+-----------+---------------+--------------+ FV DistalPartial        No       Yes        partially                                                                 re-cannalized                  +---------+---------------+---------+-----------+---------------+--------------+ PFV      Full           No       Yes                                      +---------+---------------+---------+-----------+---------------+--------------+ POP      Partial                 Yes        partially  re-cannalized                 +---------+---------------+---------+-----------+---------------+--------------+ PTV      Full                                                             +---------+---------------+---------+-----------+---------------+--------------+ PERO     Full                                                             +---------+---------------+---------+-----------+---------------+--------------+ GSV      Full                    Yes                                      +---------+---------------+---------+-----------+---------------+--------------+ SSV      Partial                 Yes                                      +---------+---------------+---------+-----------+---------------+--------------+ Thrombus extends from mid CFV to mid popliteal vein. Thrombus appears to be recanalized. Proximal edge of thrombus is distal to sapheno-femoral junction. Distal edge of thrombus is proximal to the tibio-peroneal confluence.    *See table(s) above for measurements and observations. Electronically signed by Leotis Pain MD on 11/04/2021 at 4:08:25 PM.    Final

## 2021-11-11 ENCOUNTER — Encounter: Payer: Self-pay | Admitting: Oncology

## 2021-11-11 ENCOUNTER — Inpatient Hospital Stay: Payer: Federal, State, Local not specified - PPO

## 2021-11-11 ENCOUNTER — Other Ambulatory Visit: Payer: Self-pay | Admitting: Oncology

## 2021-11-11 ENCOUNTER — Telehealth: Payer: Self-pay

## 2021-11-11 ENCOUNTER — Other Ambulatory Visit: Payer: Self-pay

## 2021-11-11 VITALS — BP 135/71 | HR 98 | Temp 98.2°F | Resp 18

## 2021-11-11 DIAGNOSIS — Z7901 Long term (current) use of anticoagulants: Secondary | ICD-10-CM | POA: Diagnosis not present

## 2021-11-11 DIAGNOSIS — D500 Iron deficiency anemia secondary to blood loss (chronic): Secondary | ICD-10-CM

## 2021-11-11 DIAGNOSIS — I82462 Acute embolism and thrombosis of left calf muscular vein: Secondary | ICD-10-CM | POA: Diagnosis not present

## 2021-11-11 DIAGNOSIS — F17290 Nicotine dependence, other tobacco product, uncomplicated: Secondary | ICD-10-CM | POA: Diagnosis not present

## 2021-11-11 DIAGNOSIS — N920 Excessive and frequent menstruation with regular cycle: Secondary | ICD-10-CM | POA: Diagnosis not present

## 2021-11-11 DIAGNOSIS — Z7952 Long term (current) use of systemic steroids: Secondary | ICD-10-CM | POA: Diagnosis not present

## 2021-11-11 DIAGNOSIS — Z79899 Other long term (current) drug therapy: Secondary | ICD-10-CM | POA: Diagnosis not present

## 2021-11-11 DIAGNOSIS — D259 Leiomyoma of uterus, unspecified: Secondary | ICD-10-CM | POA: Diagnosis not present

## 2021-11-11 LAB — PREPARE RBC (CROSSMATCH)

## 2021-11-11 LAB — ABO/RH: ABO/RH(D): O POS

## 2021-11-11 MED ORDER — SODIUM CHLORIDE 0.9 % IV SOLN
Freq: Once | INTRAVENOUS | Status: AC
  Filled 2021-11-11: qty 250

## 2021-11-11 MED ORDER — SODIUM CHLORIDE 0.9 % IV SOLN
200.0000 mg | Freq: Once | INTRAVENOUS | Status: AC
  Administered 2021-11-11: 200 mg via INTRAVENOUS
  Filled 2021-11-11: qty 200

## 2021-11-11 NOTE — Assessment & Plan Note (Signed)
Symptomatic anemia, recommend 1 unit of PRBC transfusion.  Severe Iron deficiency anemia,  Recommend IV venofer '200mg'$  x 5 doses option of IV Venofer treatment discussed  I discussed about the potential risks including but not limited to allergic reactions/infusion reactions including anaphylactic reactions, phlebitis, high blood pressure, wheezing, SOB, skin rash, weight gain, leg swelling, headache, nausea and fatigue, etc. Patient tolerates oral iron supplement poorly and desires to achieved higher level of iron faster for adequate hematopoesis. Patient is not sexually active and denies any chance of pregnancy. She knows to update me if any changes.

## 2021-11-11 NOTE — Assessment & Plan Note (Addendum)
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  Agree with continue anticoagulation, duration 3-6 months.  Check hypercoagulable work up due to age of onset.

## 2021-11-11 NOTE — Telephone Encounter (Addendum)
Spoke to patient and informed her of MD recommendations and follow up plan. Pt verbalized understanding.   Potassium is low, may be due to blood pressure medication, recommend pt to eat potassium rich foods.   Pt will be here this afternoon for lab/ venofer *new* and Blood transfusion tomorrow. Scheduling has contact pt with these appt details.   Additonal appts that need to be scheduled and pt needs to be notified:  IV venofer x2 next week and x2 the week of 8/7.   Labs in 4 weeks (cbc)  Labs prior to MD + venofer in 3 months.

## 2021-11-11 NOTE — Telephone Encounter (Signed)
Called patient and no answer. Left VM to let her know about hemoglobin and the need for blood transfusion and to give [place] a call back asap to let us know if she will proceed with Blood. We will schedule depending on what she decides.

## 2021-11-11 NOTE — Telephone Encounter (Signed)
-----   Message from Earlie Server, MD sent at 11/10/2021 11:31 PM EDT ----- Hb 6.1, recommend blood transfusion, if she agrees, please arrange.  Iron deficiency anemia, recommend IV venofer twice per week, at least 2 days apart, x 5 total doses.  Follow up in 3 months Labs prior to MD+ Venofer. Will order labs.   zy

## 2021-11-11 NOTE — Patient Instructions (Signed)
[facility]  Discharge Instructions: Thank you for choosing [facility] to provide your oncology and hematology care.  If you have a lab appointment with the Cancer Center, please go directly to the Cancer Center and check in at the registration area.  Wear comfortable clothing and clothing appropriate for easy access to any Portacath or PICC line.   We strive to give you quality time with your provider. You may need to reschedule your appointment if you arrive late (15 or more minutes).  Arriving late affects you and other patients whose appointments are after yours.  Also, if you miss three or more appointments without notifying the office, you may be dismissed from the clinic at the provider's discretion.      For prescription refill requests, have your pharmacy contact our office and allow 72 hours for refills to be completed.    Today you received the following chemotherapy and/or immunotherapy agents VENOFER      To help prevent nausea and vomiting after your treatment, we encourage you to take your nausea medication as directed.  BELOW ARE SYMPTOMS THAT SHOULD BE REPORTED IMMEDIATELY: *FEVER GREATER THAN 100.4 F (38 C) OR HIGHER *CHILLS OR SWEATING *NAUSEA AND VOMITING THAT IS NOT CONTROLLED WITH YOUR NAUSEA MEDICATION *UNUSUAL SHORTNESS OF BREATH *UNUSUAL BRUISING OR BLEEDING *URINARY PROBLEMS (pain or burning when urinating, or frequent urination) *BOWEL PROBLEMS (unusual diarrhea, constipation, pain near the anus) TENDERNESS IN MOUTH AND THROAT WITH OR WITHOUT PRESENCE OF ULCERS (sore throat, sores in mouth, or a toothache) UNUSUAL RASH, SWELLING OR PAIN  UNUSUAL VAGINAL DISCHARGE OR ITCHING   Items with * indicate a potential emergency and should be followed up as soon as possible or go to the Emergency Department if any problems should occur.  Please show the CHEMOTHERAPY ALERT CARD or IMMUNOTHERAPY ALERT CARD at check-in to the  Emergency Department and triage nurse.  Should you have questions after your visit or need to cancel or reschedule your appointment, please contact [facility]  [phone] and follow the prompts.  Office hours are 8:00 a.m. to 4:30 p.m. Monday - Friday. Please note that voicemails left after 4:00 p.m. may not be returned until the following business day.  We are closed weekends and major holidays. You have access to a nurse at all times for urgent questions. Please call the main number to the clinic [phone] and follow the prompts.  For any non-urgent questions, you may also contact your provider using MyChart. We now offer e-Visits for anyone 18 and older to request care online for non-urgent symptoms. For details visit [email or web address].   Also download the MyChart app! Go to the app store, search "MyChart", open the app, select [facility], and log in with your MyChart username and password.  Masks are optional in the cancer centers. If you would like for your care team to wear a mask while they are taking care of you, please let them know. For doctor visits, patients may have with them one support person who is at least 41 years old. At this time, visitors are not allowed in the infusion area.   Iron Sucrose Injection What is this medication? IRON SUCROSE (EYE ern SOO krose) treats low levels of iron (iron deficiency anemia) in people with kidney disease. Iron is a mineral that plays an important role in making red blood cells, which carry oxygen from your lungs to the rest of your body. This medicine may   be used for other purposes; ask your health care provider or pharmacist if you have questions. COMMON BRAND NAME(S): Venofer What should I tell my care team before I take this medication? They need to know if you have any of these conditions: Anemia not caused by low iron levels Heart disease High levels of iron in the blood Kidney disease Liver  disease An unusual or allergic reaction to iron, other medications, foods, dyes, or preservatives Pregnant or trying to get pregnant Breast-feeding How should I use this medication? This medication is for infusion into a vein. It is given in a hospital or clinic setting. Talk to your care team about the use of this medication in children. While this medication may be prescribed for children as young as 2 years for selected conditions, precautions do apply. Overdosage: If you think you have taken too much of this medicine contact a poison control center or emergency room at once. NOTE: This medicine is only for you. Do not share this medicine with others. What if I miss a dose? It is important not to miss your dose. Call your care team if you are unable to keep an appointment. What may interact with this medication? Do not take this medication with any of the following: Deferoxamine Dimercaprol Other iron products This medication may also interact with the following: Chloramphenicol Deferasirox This list may not describe all possible interactions. Give your health care provider a list of all the medicines, herbs, non-prescription drugs, or dietary supplements you use. Also tell them if you smoke, drink alcohol, or use illegal drugs. Some items may interact with your medicine. What should I watch for while using this medication? Visit your care team regularly. Tell your care team if your symptoms do not start to get better or if they get worse. You may need blood work done while you are taking this medication. You may need to follow a special diet. Talk to your care team. Foods that contain iron include: whole grains/cereals, dried fruits, beans, or peas, leafy green vegetables, and organ meats (liver, kidney). What side effects may I notice from receiving this medication? Side effects that you should report to your care team as soon as possible: Allergic reactions--skin rash, itching, hives,  swelling of the face, lips, tongue, or throat Low blood pressure--dizziness, feeling faint or lightheaded, blurry vision Shortness of breath Side effects that usually do not require medical attention (report to your care team if they continue or are bothersome): Flushing Headache Joint pain Muscle pain Nausea Pain, redness, or irritation at injection site This list may not describe all possible side effects. Call your doctor for medical advice about side effects. You may report side effects to FDA at 1-800-FDA-1088. Where should I keep my medication? This medication is given in a hospital or clinic and will not be stored at home. NOTE: This sheet is a summary. It may not cover all possible information. If you have questions about this medicine, talk to your doctor, pharmacist, or health care provider.  2023 Elsevier/Gold Standard (2020-08-28 00:00:00)   

## 2021-11-11 NOTE — Assessment & Plan Note (Addendum)
Hb <7, recommend PRBC transfusion.

## 2021-11-11 NOTE — Assessment & Plan Note (Signed)
Advise patient to follow up with gyn for management.  Embolization, ablation vs surgery.

## 2021-11-11 NOTE — Telephone Encounter (Deleted)
Per Dr. Tasia Catchings, pt will also need labs in 4 weeks (cbc). Please schedule.    Potassium is low, may be due to blood pressure medication, recommend pt to eat potassium rich foods.

## 2021-11-12 ENCOUNTER — Inpatient Hospital Stay: Payer: Federal, State, Local not specified - PPO

## 2021-11-12 DIAGNOSIS — D500 Iron deficiency anemia secondary to blood loss (chronic): Secondary | ICD-10-CM

## 2021-11-12 DIAGNOSIS — D259 Leiomyoma of uterus, unspecified: Secondary | ICD-10-CM | POA: Diagnosis not present

## 2021-11-12 DIAGNOSIS — Z7901 Long term (current) use of anticoagulants: Secondary | ICD-10-CM | POA: Diagnosis not present

## 2021-11-12 DIAGNOSIS — Z79899 Other long term (current) drug therapy: Secondary | ICD-10-CM | POA: Diagnosis not present

## 2021-11-12 DIAGNOSIS — I82462 Acute embolism and thrombosis of left calf muscular vein: Secondary | ICD-10-CM | POA: Diagnosis not present

## 2021-11-12 DIAGNOSIS — N920 Excessive and frequent menstruation with regular cycle: Secondary | ICD-10-CM | POA: Diagnosis not present

## 2021-11-12 DIAGNOSIS — Z7952 Long term (current) use of systemic steroids: Secondary | ICD-10-CM | POA: Diagnosis not present

## 2021-11-12 DIAGNOSIS — F17290 Nicotine dependence, other tobacco product, uncomplicated: Secondary | ICD-10-CM | POA: Diagnosis not present

## 2021-11-12 LAB — ANTIPHOSPHOLIPID SYNDROME PROF
Anticardiolipin IgG: <9 GPL U/mL (ref 0–14)
Anticardiolipin IgM: <9 MPL U/mL (ref 0–12)
DRVVT: 58.2 s — ABNORMAL HIGH (ref 0.0–47.0)
PTT Lupus Anticoagulant: 36.8 s (ref 0.0–43.5)

## 2021-11-12 LAB — PROTEIN C ACTIVITY: Protein C Activity: 61 % — ABNORMAL LOW (ref 73–180)

## 2021-11-12 LAB — DRVVT CONFIRM: dRVVT Confirm: 1.1 ratio (ref 0.8–1.2)

## 2021-11-12 LAB — PROTEIN S, TOTAL AND FREE
Protein S Ag, Free: 100 % (ref 61–136)
Protein S Ag, Total: 66 % (ref 60–150)

## 2021-11-12 LAB — BETA-2-GLYCOPROTEIN I ABS, IGG/M/A
Beta-2 Glyco I IgG: <9 GPI IgG units (ref 0–20)
Beta-2-Glycoprotein I IgA: <9 GPI IgA units (ref 0–25)
Beta-2-Glycoprotein I IgM: <9 GPI IgM units (ref 0–32)

## 2021-11-12 LAB — DRVVT MIX: dRVVT Mix: 47.7 s — ABNORMAL HIGH (ref 0.0–40.4)

## 2021-11-12 MED ORDER — SODIUM CHLORIDE 0.9% IV SOLUTION
250.0000 mL | Freq: Once | INTRAVENOUS | Status: AC
  Administered 2021-11-12: 250 mL via INTRAVENOUS
  Filled 2021-11-12: qty 250

## 2021-11-12 MED ORDER — ACETAMINOPHEN 325 MG PO TABS
650.0000 mg | ORAL_TABLET | Freq: Once | ORAL | Status: AC
  Administered 2021-11-12: 650 mg via ORAL
  Filled 2021-11-12: qty 2

## 2021-11-12 MED ORDER — DIPHENHYDRAMINE HCL 25 MG PO CAPS
25.0000 mg | ORAL_CAPSULE | Freq: Once | ORAL | Status: AC
  Administered 2021-11-12: 25 mg via ORAL
  Filled 2021-11-12: qty 1

## 2021-11-12 NOTE — Patient Instructions (Signed)
Blood Transfusion, Adult, Care After ?This sheet gives you information about how to care for yourself after your procedure. Your health care provider may also give you more specific instructions. If you have problems or questions, contact your health care provider. ?What can I expect after the procedure? ?After the procedure, it is common to have: ?Bruising and soreness where the IV was inserted. ?A headache. ?Follow these instructions at home: ?IV insertion site care ? ?  ? ?Follow instructions from your health care provider about how to take care of your IV insertion site. Make sure you: ?Wash your hands with soap and water before and after you change your bandage (dressing). If soap and water are not available, use hand sanitizer. ?Change your dressing as told by your health care provider. ?Check your IV insertion site every day for signs of infection. Check for: ?Redness, swelling, or pain. ?Bleeding from the site. ?Warmth. ?Pus or a bad smell. ?General instructions ?Take over-the-counter and prescription medicines only as told by your health care provider. ?Rest as told by your health care provider. ?Return to your normal activities as told by your health care provider. ?Keep all follow-up visits as told by your health care provider. This is important. ?Contact a health care provider if: ?You have itching or red, swollen areas of skin (hives). ?You feel anxious. ?You feel weak after doing your normal activities. ?You have redness, swelling, warmth, or pain around the IV insertion site. ?You have blood coming from the IV insertion site that does not stop with pressure. ?You have pus or a bad smell coming from your IV insertion site. ?Get help right away if: ?You have symptoms of a serious allergic or immune system reaction, including: ?Trouble breathing or shortness of breath. ?Swelling of the face or feeling flushed. ?Fever or chills. ?Pain in the head, back, or chest. ?Dark urine or blood in the  urine. ?Widespread rash. ?Fast heartbeat. ?Feeling dizzy or light-headed. ?If you receive your blood transfusion in an outpatient setting, you will be told whom to contact to report any reactions. ?These symptoms may represent a serious problem that is an emergency. Do not wait to see if the symptoms will go away. Get medical help right away. Call your local emergency services (911 in the [place] Do not drive yourself to the hospital. ?Summary ?Bruising and tenderness around the IV insertion site are common. ?Check your IV insertion site every day for signs of infection. ?Rest as told by your health care provider. Return to your normal activities as told by your health care provider. ?Get help right away for symptoms of a serious allergic or immune system reaction to blood transfusion. ?This information is not intended to replace advice given to you by your health care provider. Make sure you discuss any questions you have with your health care provider. ?Document Revised: 07/30/2020 Document Reviewed: 09/27/2018 ?Elsevier Patient Education ? 2023 Elsevier Inc. ? ?

## 2021-11-13 LAB — TYPE AND SCREEN
ABO/RH(D): O POS
Antibody Screen: NEGATIVE
Unit division: 0

## 2021-11-13 LAB — BPAM RBC
Blood Product Expiration Date: 202309022359
ISSUE DATE / TIME: 202307280940
Unit Type and Rh: 5100

## 2021-11-15 ENCOUNTER — Inpatient Hospital Stay: Payer: Federal, State, Local not specified - PPO

## 2021-11-15 VITALS — BP 124/89 | HR 95 | Temp 97.8°F | Resp 18

## 2021-11-15 DIAGNOSIS — D259 Leiomyoma of uterus, unspecified: Secondary | ICD-10-CM | POA: Diagnosis not present

## 2021-11-15 DIAGNOSIS — Z7952 Long term (current) use of systemic steroids: Secondary | ICD-10-CM | POA: Diagnosis not present

## 2021-11-15 DIAGNOSIS — D500 Iron deficiency anemia secondary to blood loss (chronic): Secondary | ICD-10-CM

## 2021-11-15 DIAGNOSIS — F17290 Nicotine dependence, other tobacco product, uncomplicated: Secondary | ICD-10-CM | POA: Diagnosis not present

## 2021-11-15 DIAGNOSIS — N920 Excessive and frequent menstruation with regular cycle: Secondary | ICD-10-CM | POA: Diagnosis not present

## 2021-11-15 DIAGNOSIS — Z7901 Long term (current) use of anticoagulants: Secondary | ICD-10-CM | POA: Diagnosis not present

## 2021-11-15 DIAGNOSIS — I82462 Acute embolism and thrombosis of left calf muscular vein: Secondary | ICD-10-CM | POA: Diagnosis not present

## 2021-11-15 DIAGNOSIS — Z79899 Other long term (current) drug therapy: Secondary | ICD-10-CM | POA: Diagnosis not present

## 2021-11-15 LAB — FACTOR 5 LEIDEN

## 2021-11-15 MED ORDER — SODIUM CHLORIDE 0.9 % IV SOLN
200.0000 mg | Freq: Once | INTRAVENOUS | Status: AC
  Administered 2021-11-15: 200 mg via INTRAVENOUS
  Filled 2021-11-15: qty 200

## 2021-11-15 MED ORDER — SODIUM CHLORIDE 0.9 % IV SOLN
Freq: Once | INTRAVENOUS | Status: AC
  Filled 2021-11-15: qty 250

## 2021-11-15 NOTE — Progress Notes (Signed)
Pt reports intermittent "gas pains" lasting all weekend beginning shortly after receiving her blood transfusion on Friday.

## 2021-11-17 MED FILL — Iron Sucrose Inj 20 MG/ML (Fe Equiv): INTRAVENOUS | Qty: 10 | Status: AC

## 2021-11-18 ENCOUNTER — Inpatient Hospital Stay: Payer: Federal, State, Local not specified - PPO | Attending: Oncology

## 2021-11-18 ENCOUNTER — Other Ambulatory Visit: Payer: Self-pay | Admitting: Oncology

## 2021-11-18 VITALS — BP 130/81 | HR 93 | Temp 97.3°F

## 2021-11-18 DIAGNOSIS — D500 Iron deficiency anemia secondary to blood loss (chronic): Secondary | ICD-10-CM | POA: Insufficient documentation

## 2021-11-18 DIAGNOSIS — D6859 Other primary thrombophilia: Secondary | ICD-10-CM

## 2021-11-18 DIAGNOSIS — N920 Excessive and frequent menstruation with regular cycle: Secondary | ICD-10-CM | POA: Diagnosis not present

## 2021-11-18 DIAGNOSIS — Z86718 Personal history of other venous thrombosis and embolism: Secondary | ICD-10-CM | POA: Diagnosis not present

## 2021-11-18 LAB — PROTHROMBIN GENE MUTATION

## 2021-11-18 MED ORDER — SODIUM CHLORIDE 0.9 % IV SOLN
Freq: Once | INTRAVENOUS | Status: AC
  Filled 2021-11-18: qty 250

## 2021-11-18 MED ORDER — SODIUM CHLORIDE 0.9 % IV SOLN
200.0000 mg | Freq: Once | INTRAVENOUS | Status: AC
  Administered 2021-11-18: 200 mg via INTRAVENOUS
  Filled 2021-11-18: qty 200

## 2021-11-18 NOTE — Patient Instructions (Signed)

## 2021-11-22 ENCOUNTER — Inpatient Hospital Stay: Payer: Federal, State, Local not specified - PPO

## 2021-11-22 VITALS — BP 139/86 | HR 90 | Temp 96.1°F | Resp 18

## 2021-11-22 DIAGNOSIS — N920 Excessive and frequent menstruation with regular cycle: Secondary | ICD-10-CM | POA: Diagnosis not present

## 2021-11-22 DIAGNOSIS — Z86718 Personal history of other venous thrombosis and embolism: Secondary | ICD-10-CM | POA: Diagnosis not present

## 2021-11-22 DIAGNOSIS — D500 Iron deficiency anemia secondary to blood loss (chronic): Secondary | ICD-10-CM | POA: Diagnosis not present

## 2021-11-22 MED ORDER — SODIUM CHLORIDE 0.9 % IV SOLN
200.0000 mg | Freq: Once | INTRAVENOUS | Status: AC
  Administered 2021-11-22: 200 mg via INTRAVENOUS
  Filled 2021-11-22: qty 200

## 2021-11-22 MED ORDER — SODIUM CHLORIDE 0.9 % IV SOLN
Freq: Once | INTRAVENOUS | Status: AC
  Filled 2021-11-22: qty 250

## 2021-11-22 NOTE — Patient Instructions (Signed)
[facility] CANCER CTR AT [place]  Discharge Instructions: Thank you for choosing [facility] to provide your oncology and hematology care.  If you have a lab appointment with the [facility], please go directly to the [facility] and check in at the registration area.  Wear comfortable clothing and clothing appropriate for easy access to any Portacath or PICC line.   We strive to give you quality time with your provider. You may need to reschedule your appointment if you arrive late (15 or more minutes).  Arriving late affects you and other patients whose appointments are after yours.  Also, if you miss three or more appointments without notifying the office, you may be dismissed from the clinic at the provider's discretion.      For prescription refill requests, have your pharmacy contact our office and allow 72 hours for refills to be completed.    Today you received the following chemotherapy and/or immunotherapy agents VENOFER      To help prevent nausea and vomiting after your treatment, we encourage you to take your nausea medication as directed.  BELOW ARE SYMPTOMS THAT SHOULD BE REPORTED IMMEDIATELY: *FEVER GREATER THAN 100.4 F (38 C) OR HIGHER *CHILLS OR SWEATING *NAUSEA AND VOMITING THAT IS NOT CONTROLLED WITH YOUR NAUSEA MEDICATION *UNUSUAL SHORTNESS OF BREATH *UNUSUAL BRUISING OR BLEEDING *URINARY PROBLEMS (pain or burning when urinating, or frequent urination) *BOWEL PROBLEMS (unusual diarrhea, constipation, pain near the anus) TENDERNESS IN MOUTH AND THROAT WITH OR WITHOUT PRESENCE OF ULCERS (sore throat, sores in mouth, or a toothache) UNUSUAL RASH, SWELLING OR PAIN  UNUSUAL VAGINAL DISCHARGE OR ITCHING   Items with * indicate a potential emergency and should be followed up as soon as possible or go to the Emergency Department if any problems should occur.  Please show the CHEMOTHERAPY Iron Sucrose Injection What is this medication? IRON  SUCROSE (EYE ern SOO krose) treats low levels of iron (iron deficiency anemia) in people with kidney disease. Iron is a mineral that plays an important role in making red blood cells, which carry oxygen from your lungs to the rest of your body. This medicine may be used for other purposes; ask your health care provider or pharmacist if you have questions. COMMON BRAND NAME(S): Venofer What should I tell my care team before I take this medication? They need to know if you have any of these conditions: Anemia not caused by low iron levels Heart disease High levels of iron in the blood Kidney disease Liver disease An unusual or allergic reaction to iron, other medications, foods, dyes, or preservatives Pregnant or trying to get pregnant Breast-feeding How should I use this medication? This medication is for infusion into a vein. It is given in a hospital or clinic setting. Talk to your care team about the use of this medication in children. While this medication may be prescribed for children as young as 2 years for selected conditions, precautions do apply. Overdosage: If you think you have taken too much of this medicine contact a poison control center or emergency room at once. NOTE: This medicine is only for you. Do not share this medicine with others. What if I miss a dose? It is important not to miss your dose. Call your care team if you are unable to keep an appointment. What may interact with this medication? Do not take this medication with any of the following: Deferoxamine Dimercaprol Other iron products This medication may also interact with the following: Chloramphenicol Deferasirox  This list may not describe all possible interactions. Give your health care provider a list of all the medicines, herbs, non-prescription drugs, or dietary supplements you use. Also tell them if you smoke, drink alcohol, or use illegal drugs. Some items may interact with your medicine. What should I  watch for while using this medication? Visit your care team regularly. Tell your care team if your symptoms do not start to get better or if they get worse. You may need blood work done while you are taking this medication. You may need to follow a special diet. Talk to your care team. Foods that contain iron include: whole grains/cereals, dried fruits, beans, or peas, leafy green vegetables, and organ meats (liver, kidney). What side effects may I notice from receiving this medication? Side effects that you should report to your care team as soon as possible: Allergic reactions--skin rash, itching, hives, swelling of the face, lips, tongue, or throat Low blood pressure--dizziness, feeling faint or lightheaded, blurry vision Shortness of breath Side effects that usually do not require medical attention (report to your care team if they continue or are bothersome): Flushing Headache Joint pain Muscle pain Nausea Pain, redness, or irritation at injection site This list may not describe all possible side effects. Call your doctor for medical advice about side effects. You may report side effects to FDA at 1-800-FDA-1088. Where should I keep my medication? This medication is given in a hospital or clinic and will not be stored at home. NOTE: This sheet is a summary. It may not cover all possible information. If you have questions about this medicine, talk to your doctor, pharmacist, or health care provider.  2023 Elsevier/Gold Standard (2007-05-26 00:00:00)   Should you have questions after your visit or need to cancel or reschedule your appointment, please contact [facility] CANCER [place]  [phone] and follow the prompts.  Office hours are 8:00 a.m. to 4:30 p.m. Monday - Friday. Please note that voicemails left after 4:00 p.m. may not be returned until the following business day.  We are closed weekends and major holidays. You have access to a nurse at all times for  urgent questions. Please call the main number to the clinic [phone] and follow the prompts.  For any non-urgent questions, you may also contact your provider using MyChart. We now offer e-Visits for anyone 15 and older to request care online for non-urgent symptoms. For details visit [email or web address].   Also download the MyChart app! Go to the app store, search "MyChart", open the app, select [facility], and log in with your MyChart username and password.  Masks are optional in the cancer centers. If you would like for your care team to wear a mask while they are taking care of you, please let them know. For doctor visits, patients may have with them one support person who is at least 41 years old. At this time, visitors are not allowed in the infusion area.

## 2021-11-24 MED FILL — Iron Sucrose Inj 20 MG/ML (Fe Equiv): INTRAVENOUS | Qty: 10 | Status: AC

## 2021-11-25 ENCOUNTER — Inpatient Hospital Stay: Payer: Federal, State, Local not specified - PPO

## 2021-11-25 VITALS — BP 137/90 | HR 85 | Temp 98.6°F

## 2021-11-25 DIAGNOSIS — D500 Iron deficiency anemia secondary to blood loss (chronic): Secondary | ICD-10-CM | POA: Diagnosis not present

## 2021-11-25 DIAGNOSIS — Z86718 Personal history of other venous thrombosis and embolism: Secondary | ICD-10-CM | POA: Diagnosis not present

## 2021-11-25 DIAGNOSIS — N920 Excessive and frequent menstruation with regular cycle: Secondary | ICD-10-CM | POA: Diagnosis not present

## 2021-11-25 MED ORDER — SODIUM CHLORIDE 0.9 % IV SOLN
200.0000 mg | Freq: Once | INTRAVENOUS | Status: AC
  Administered 2021-11-25: 200 mg via INTRAVENOUS
  Filled 2021-11-25: qty 200

## 2021-11-25 MED ORDER — SODIUM CHLORIDE 0.9% FLUSH
10.0000 mL | Freq: Once | INTRAVENOUS | Status: AC | PRN
  Administered 2021-11-25: 10 mL
  Filled 2021-11-25: qty 10

## 2021-11-25 MED ORDER — SODIUM CHLORIDE 0.9 % IV SOLN
Freq: Once | INTRAVENOUS | Status: AC
  Filled 2021-11-25: qty 250

## 2021-11-25 NOTE — Progress Notes (Signed)
Patient tolerated Venofer infusion well, no questions/concerns voiced. Patient stable at discharge. Refused AVS .   

## 2021-12-01 DIAGNOSIS — Z124 Encounter for screening for malignant neoplasm of cervix: Secondary | ICD-10-CM | POA: Diagnosis not present

## 2021-12-01 DIAGNOSIS — D251 Intramural leiomyoma of uterus: Secondary | ICD-10-CM | POA: Diagnosis not present

## 2021-12-01 DIAGNOSIS — R87610 Atypical squamous cells of undetermined significance on cytologic smear of cervix (ASC-US): Secondary | ICD-10-CM | POA: Diagnosis not present

## 2021-12-01 DIAGNOSIS — D6859 Other primary thrombophilia: Secondary | ICD-10-CM | POA: Diagnosis not present

## 2021-12-01 DIAGNOSIS — N921 Excessive and frequent menstruation with irregular cycle: Secondary | ICD-10-CM | POA: Diagnosis not present

## 2021-12-01 DIAGNOSIS — D250 Submucous leiomyoma of uterus: Secondary | ICD-10-CM | POA: Diagnosis not present

## 2021-12-09 ENCOUNTER — Other Ambulatory Visit: Payer: Self-pay

## 2021-12-09 ENCOUNTER — Inpatient Hospital Stay: Payer: Federal, State, Local not specified - PPO

## 2021-12-09 DIAGNOSIS — I10 Essential (primary) hypertension: Secondary | ICD-10-CM | POA: Diagnosis not present

## 2021-12-09 DIAGNOSIS — N920 Excessive and frequent menstruation with regular cycle: Secondary | ICD-10-CM | POA: Diagnosis not present

## 2021-12-09 DIAGNOSIS — D500 Iron deficiency anemia secondary to blood loss (chronic): Secondary | ICD-10-CM

## 2021-12-09 DIAGNOSIS — I825Y2 Chronic embolism and thrombosis of unspecified deep veins of left proximal lower extremity: Secondary | ICD-10-CM | POA: Diagnosis not present

## 2021-12-09 DIAGNOSIS — Z86718 Personal history of other venous thrombosis and embolism: Secondary | ICD-10-CM | POA: Diagnosis not present

## 2021-12-09 LAB — CBC WITH DIFFERENTIAL/PLATELET
Abs Immature Granulocytes: 0.03 10*3/uL (ref 0.00–0.07)
Basophils Absolute: 0.1 10*3/uL (ref 0.0–0.1)
Basophils Relative: 1 %
Eosinophils Absolute: 0 10*3/uL (ref 0.0–0.5)
Eosinophils Relative: 0 %
HCT: 38.1 % (ref 36.0–46.0)
Hemoglobin: 11.3 g/dL — ABNORMAL LOW (ref 12.0–15.0)
Immature Granulocytes: 0 %
Lymphocytes Relative: 15 %
Lymphs Abs: 1.4 10*3/uL (ref 0.7–4.0)
MCH: 25.2 pg — ABNORMAL LOW (ref 26.0–34.0)
MCHC: 29.7 g/dL — ABNORMAL LOW (ref 30.0–36.0)
MCV: 84.9 fL (ref 80.0–100.0)
Monocytes Absolute: 1.1 10*3/uL — ABNORMAL HIGH (ref 0.1–1.0)
Monocytes Relative: 12 %
Neutro Abs: 6.4 10*3/uL (ref 1.7–7.7)
Neutrophils Relative %: 72 %
Platelets: 375 10*3/uL (ref 150–400)
RBC: 4.49 MIL/uL (ref 3.87–5.11)
RDW: 27 % — ABNORMAL HIGH (ref 11.5–15.5)
WBC: 9.1 10*3/uL (ref 4.0–10.5)
nRBC: 0 % (ref 0.0–0.2)

## 2021-12-22 ENCOUNTER — Telehealth: Payer: Self-pay | Admitting: *Deleted

## 2021-12-22 ENCOUNTER — Other Ambulatory Visit: Payer: Self-pay

## 2021-12-22 DIAGNOSIS — D500 Iron deficiency anemia secondary to blood loss (chronic): Secondary | ICD-10-CM

## 2021-12-22 NOTE — Telephone Encounter (Signed)
Please contact pt to set up lab appt, per her availability. Will obtain iron labs and Dr. Tasia Catchings will review prior to scheduling any blood/iron.

## 2021-12-22 NOTE — Telephone Encounter (Addendum)
Patient called asking if she can come in for lab check reporting that she has been having heavy menstruation and started feeling lightheaded yesterday like when she had to have a transfusion in past. Please advise

## 2021-12-23 ENCOUNTER — Encounter: Payer: Self-pay | Admitting: Oncology

## 2021-12-23 ENCOUNTER — Inpatient Hospital Stay: Payer: Federal, State, Local not specified - PPO | Attending: Oncology

## 2021-12-23 DIAGNOSIS — N920 Excessive and frequent menstruation with regular cycle: Secondary | ICD-10-CM | POA: Diagnosis not present

## 2021-12-23 DIAGNOSIS — D500 Iron deficiency anemia secondary to blood loss (chronic): Secondary | ICD-10-CM | POA: Insufficient documentation

## 2021-12-23 LAB — CBC WITH DIFFERENTIAL/PLATELET
Abs Immature Granulocytes: 0.02 10*3/uL (ref 0.00–0.07)
Basophils Absolute: 0 10*3/uL (ref 0.0–0.1)
Basophils Relative: 1 %
Eosinophils Absolute: 0 10*3/uL (ref 0.0–0.5)
Eosinophils Relative: 1 %
HCT: 31.3 % — ABNORMAL LOW (ref 36.0–46.0)
Hemoglobin: 9.5 g/dL — ABNORMAL LOW (ref 12.0–15.0)
Immature Granulocytes: 0 %
Lymphocytes Relative: 17 %
Lymphs Abs: 1.3 10*3/uL (ref 0.7–4.0)
MCH: 26.1 pg (ref 26.0–34.0)
MCHC: 30.4 g/dL (ref 30.0–36.0)
MCV: 86 fL (ref 80.0–100.0)
Monocytes Absolute: 1 10*3/uL (ref 0.1–1.0)
Monocytes Relative: 12 %
Neutro Abs: 5.4 10*3/uL (ref 1.7–7.7)
Neutrophils Relative %: 69 %
Platelets: 512 10*3/uL — ABNORMAL HIGH (ref 150–400)
RBC: 3.64 MIL/uL — ABNORMAL LOW (ref 3.87–5.11)
RDW: 23.7 % — ABNORMAL HIGH (ref 11.5–15.5)
WBC: 7.7 10*3/uL (ref 4.0–10.5)
nRBC: 0 % (ref 0.0–0.2)

## 2021-12-23 LAB — IRON AND TIBC
Iron: 16 ug/dL — ABNORMAL LOW (ref 28–170)
Saturation Ratios: 4 % — ABNORMAL LOW (ref 10.4–31.8)
TIBC: 430 ug/dL (ref 250–450)
UIBC: 414 ug/dL

## 2021-12-23 LAB — FERRITIN: Ferritin: 11 ng/mL (ref 11–307)

## 2021-12-24 ENCOUNTER — Telehealth: Payer: Self-pay

## 2021-12-24 NOTE — Telephone Encounter (Signed)
Called and spoke to pt. Answered her questions regarding Blood transfusion and Venofer. Pt reports that she has had a heavy menstrual cycle and was feeling weak a few days after so that's why she wanted her labs checked.

## 2021-12-24 NOTE — Telephone Encounter (Signed)
-----   Message from Earlie Server, MD sent at 12/23/2021 11:16 PM EDT ----- Please arrange her to get additional Venofer weekly x3.  Keep currently scheduled follow up appt

## 2021-12-29 ENCOUNTER — Inpatient Hospital Stay: Payer: Federal, State, Local not specified - PPO

## 2021-12-29 VITALS — BP 140/85 | HR 87 | Temp 96.6°F | Resp 18

## 2021-12-29 DIAGNOSIS — N920 Excessive and frequent menstruation with regular cycle: Secondary | ICD-10-CM | POA: Diagnosis not present

## 2021-12-29 DIAGNOSIS — D500 Iron deficiency anemia secondary to blood loss (chronic): Secondary | ICD-10-CM

## 2021-12-29 MED ORDER — SODIUM CHLORIDE 0.9 % IV SOLN
200.0000 mg | Freq: Once | INTRAVENOUS | Status: AC
  Administered 2021-12-29: 200 mg via INTRAVENOUS
  Filled 2021-12-29: qty 200

## 2021-12-29 MED ORDER — SODIUM CHLORIDE 0.9 % IV SOLN
INTRAVENOUS | Status: DC
  Filled 2021-12-29: qty 250

## 2021-12-29 NOTE — Patient Instructions (Signed)
[facility] AT [facility]  Discharge Instructions: Thank you for choosing [facility] to provide your oncology and hematology care.  If you have a lab appointment with the Cancer Center, please go directly to the Cancer Center and check in at the registration area.  Wear comfortable clothing and clothing appropriate for easy access to any Portacath or PICC line.   We strive to give you quality time with your provider. You may need to reschedule your appointment if you arrive late (15 or more minutes).  Arriving late affects you and other patients whose appointments are after yours.  Also, if you miss three or more appointments without notifying the office, you may be dismissed from the clinic at the provider's discretion.      For prescription refill requests, have your pharmacy contact our office and allow 72 hours for refills to be completed.    Today you received the following chemotherapy and/or immunotherapy agents Iron Sucrose.      To help prevent nausea and vomiting after your treatment, we encourage you to take your nausea medication as directed.  BELOW ARE SYMPTOMS THAT SHOULD BE REPORTED IMMEDIATELY: *FEVER GREATER THAN 100.4 F (38 C) OR HIGHER *CHILLS OR SWEATING *NAUSEA AND VOMITING THAT IS NOT CONTROLLED WITH YOUR NAUSEA MEDICATION *UNUSUAL SHORTNESS OF BREATH *UNUSUAL BRUISING OR BLEEDING *URINARY PROBLEMS (pain or burning when urinating, or frequent urination) *BOWEL PROBLEMS (unusual diarrhea, constipation, pain near the anus) TENDERNESS IN MOUTH AND THROAT WITH OR WITHOUT PRESENCE OF ULCERS (sore throat, sores in mouth, or a toothache) UNUSUAL RASH, SWELLING OR PAIN  UNUSUAL VAGINAL DISCHARGE OR ITCHING   Items with * indicate a potential emergency and should be followed up as soon as possible or go to the Emergency Department if any problems should occur.  Please show the CHEMOTHERAPY ALERT CARD or IMMUNOTHERAPY ALERT CARD at check-in  to the Emergency Department and triage nurse.  Should you have questions after your visit or need to cancel or reschedule your appointment, please contact [facility] [place]  [phone] and follow the prompts.  Office hours are 8:00 a.m. to 4:30 p.m. Monday - Friday. Please note that voicemails left after 4:00 p.m. may not be returned until the following business day.  We are closed weekends and major holidays. You have access to a nurse at all times for urgent questions. Please call the main number to the clinic [phone] and follow the prompts.  For any non-urgent questions, you may also contact your provider using MyChart. We now offer e-Visits for anyone 18 and older to request care online for non-urgent symptoms. For details visit [email or web address].   Also download the MyChart app! Go to the app store, search "MyChart", open the app, select [facility], and log in with your MyChart username and password.  Masks are optional in the cancer centers. If you would like for your care team to wear a mask while they are taking care of you, please let them know. For doctor visits, patients may have with them one support person who is at least 41 years old. At this time, visitors are not allowed in the infusion area.   

## 2022-01-05 ENCOUNTER — Inpatient Hospital Stay: Payer: Federal, State, Local not specified - PPO

## 2022-01-05 VITALS — BP 134/87 | HR 95 | Temp 98.1°F | Resp 18

## 2022-01-05 DIAGNOSIS — N920 Excessive and frequent menstruation with regular cycle: Secondary | ICD-10-CM | POA: Diagnosis not present

## 2022-01-05 DIAGNOSIS — D500 Iron deficiency anemia secondary to blood loss (chronic): Secondary | ICD-10-CM | POA: Diagnosis not present

## 2022-01-05 MED ORDER — SODIUM CHLORIDE 0.9 % IV SOLN
INTRAVENOUS | Status: DC
  Filled 2022-01-05: qty 250

## 2022-01-05 MED ORDER — SODIUM CHLORIDE 0.9 % IV SOLN
200.0000 mg | Freq: Once | INTRAVENOUS | Status: AC
  Administered 2022-01-05: 200 mg via INTRAVENOUS
  Filled 2022-01-05: qty 200

## 2022-01-05 NOTE — Patient Instructions (Signed)
[facility] CANCER CTR AT [place]  Discharge Instructions: Thank you for choosing [facility] to provide your oncology and hematology care.  If you have a lab appointment with the [facility], please go directly to the [place] [facility] and check in at the registration area.  Wear comfortable clothing and clothing appropriate for easy access to any Portacath or PICC line.   We strive to give you quality time with your provider. You may need to reschedule your appointment if you arrive late (15 or more minutes).  Arriving late affects you and other patients whose appointments are after yours.  Also, if you miss three or more appointments without notifying the office, you may be dismissed from the clinic at the provider's discretion.      For prescription refill requests, have your pharmacy contact our office and allow 72 hours for refills to be completed.    Today you received the following chemotherapy and/or immunotherapy agents Iron Sucrose.  Iron Sucrose Injection What is this medication? IRON SUCROSE (EYE ern SOO krose) treats low levels of iron (iron deficiency anemia) in people with kidney disease. Iron is a mineral that plays an important role in making red blood cells, which carry oxygen from your lungs to the rest of your body. This medicine may be used for other purposes; ask your health care provider or pharmacist if you have questions. COMMON BRAND NAME(S): Venofer What should I tell my care team before I take this medication? They need to know if you have any of these conditions: Anemia not caused by low iron levels Heart disease High levels of iron in the blood Kidney disease Liver disease An unusual or allergic reaction to iron, other medications, foods, dyes, or preservatives Pregnant or trying to get pregnant Breast-feeding How should I use this medication? This medication is for infusion into a vein. It is given in a hospital or clinic  setting. Talk to your care team about the use of this medication in children. While this medication may be prescribed for children as young as 2 years for selected conditions, precautions do apply. Overdosage: If you think you have taken too much of this medicine contact a poison control center or emergency room at once. NOTE: This medicine is only for you. Do not share this medicine with others. What if I miss a dose? It is important not to miss your dose. Call your care team if you are unable to keep an appointment. What may interact with this medication? Do not take this medication with any of the following: Deferoxamine Dimercaprol Other iron products This medication may also interact with the following: Chloramphenicol Deferasirox This list may not describe all possible interactions. Give your health care provider a list of all the medicines, herbs, non-prescription drugs, or dietary supplements you use. Also tell them if you smoke, drink alcohol, or use illegal drugs. Some items may interact with your medicine. What should I watch for while using this medication? Visit your care team regularly. Tell your care team if your symptoms do not start to get better or if they get worse. You may need blood work done while you are taking this medication. You may need to follow a special diet. Talk to your care team. Foods that contain iron include: whole grains/cereals, dried fruits, beans, or peas, leafy green vegetables, and organ meats (liver, kidney). What side effects may I notice from receiving this medication? Side effects that you should report to your care team as soon  as possible: Allergic reactions--skin rash, itching, hives, swelling of the face, lips, tongue, or throat Low blood pressure--dizziness, feeling faint or lightheaded, blurry vision Shortness of breath Side effects that usually do not require medical attention (report to your care team if they continue or are  bothersome): Flushing Headache Joint pain Muscle pain Nausea Pain, redness, or irritation at injection site This list may not describe all possible side effects. Call your doctor for medical advice about side effects. You may report side effects to FDA at 1-800-FDA-1088. Where should I keep my medication? This medication is given in a hospital or clinic and will not be stored at home. NOTE: This sheet is a summary. It may not cover all possible information. If you have questions about this medicine, talk to your doctor, pharmacist, or health care provider.  2023 Elsevier/Gold Standard (2007-05-26 00:00:00)       To help prevent nausea and vomiting after your treatment, we encourage you to take your nausea medication as directed.  BELOW ARE SYMPTOMS THAT SHOULD BE REPORTED IMMEDIATELY: *FEVER GREATER THAN 100.4 F (38 C) OR HIGHER *CHILLS OR SWEATING *NAUSEA AND VOMITING THAT IS NOT CONTROLLED WITH YOUR NAUSEA MEDICATION *UNUSUAL SHORTNESS OF BREATH *UNUSUAL BRUISING OR BLEEDING *URINARY PROBLEMS (pain or burning when urinating, or frequent urination) *BOWEL PROBLEMS (unusual diarrhea, constipation, pain near the anus) TENDERNESS IN MOUTH AND THROAT WITH OR WITHOUT PRESENCE OF ULCERS (sore throat, sores in mouth, or a toothache) UNUSUAL RASH, SWELLING OR PAIN  UNUSUAL VAGINAL DISCHARGE OR ITCHING   Items with * indicate a potential emergency and should be followed up as soon as possible or go to the Emergency Department if any problems should occur.  Please show the CHEMOTHERAPY ALERT CARD or IMMUNOTHERAPY ALERT CARD at check-in to the Emergency Department and triage nurse.  Should you have questions after your visit or need to cancel or reschedule your appointment, please contact [facility] CANCER [place] AT [place]  [phone] and follow the prompts.  Office hours are 8:00 a.m. to 4:30 p.m. Monday - Friday. Please note that voicemails left after 4:00 p.m. may not be  returned until the following business day.  We are closed weekends and major holidays. You have access to a nurse at all times for urgent questions. Please call the main number to the clinic [phone] and follow the prompts.  For any non-urgent questions, you may also contact your provider using MyChart. We now offer e-Visits for anyone 31 and older to request care online for non-urgent symptoms. For details visit [email or web address].   Also download the MyChart app! Go to the app store, search "MyChart", open the app, select [place], and log in with your MyChart username and password.  Masks are optional in the cancer centers. If you would like for your care team to wear a mask while they are taking care of you, please let them know. For doctor visits, patients may have with them one support person who is at least 41 years old. At this time, visitors are not allowed in the infusion area.

## 2022-01-12 ENCOUNTER — Inpatient Hospital Stay: Payer: Federal, State, Local not specified - PPO

## 2022-01-12 VITALS — BP 127/70 | HR 81 | Temp 98.1°F | Resp 20

## 2022-01-12 DIAGNOSIS — N920 Excessive and frequent menstruation with regular cycle: Secondary | ICD-10-CM | POA: Diagnosis not present

## 2022-01-12 DIAGNOSIS — D500 Iron deficiency anemia secondary to blood loss (chronic): Secondary | ICD-10-CM | POA: Diagnosis not present

## 2022-01-12 MED ORDER — SODIUM CHLORIDE 0.9 % IV SOLN
200.0000 mg | Freq: Once | INTRAVENOUS | Status: AC
  Administered 2022-01-12: 200 mg via INTRAVENOUS
  Filled 2022-01-12: qty 200

## 2022-01-12 MED ORDER — SODIUM CHLORIDE 0.9 % IV SOLN
INTRAVENOUS | Status: DC
  Filled 2022-01-12: qty 250

## 2022-01-30 ENCOUNTER — Encounter (INDEPENDENT_AMBULATORY_CARE_PROVIDER_SITE_OTHER): Payer: Self-pay | Admitting: Vascular Surgery

## 2022-01-31 ENCOUNTER — Other Ambulatory Visit (INDEPENDENT_AMBULATORY_CARE_PROVIDER_SITE_OTHER): Payer: Self-pay | Admitting: Nurse Practitioner

## 2022-01-31 DIAGNOSIS — M79604 Pain in right leg: Secondary | ICD-10-CM

## 2022-01-31 NOTE — Telephone Encounter (Signed)
Bring her in for bilateral DVT studies only

## 2022-02-02 ENCOUNTER — Ambulatory Visit (INDEPENDENT_AMBULATORY_CARE_PROVIDER_SITE_OTHER): Payer: Federal, State, Local not specified - PPO

## 2022-02-02 DIAGNOSIS — M79604 Pain in right leg: Secondary | ICD-10-CM

## 2022-02-02 DIAGNOSIS — M79605 Pain in left leg: Secondary | ICD-10-CM

## 2022-02-11 ENCOUNTER — Inpatient Hospital Stay: Payer: Federal, State, Local not specified - PPO | Attending: Oncology

## 2022-02-11 DIAGNOSIS — D508 Other iron deficiency anemias: Secondary | ICD-10-CM | POA: Insufficient documentation

## 2022-02-11 DIAGNOSIS — Z86718 Personal history of other venous thrombosis and embolism: Secondary | ICD-10-CM | POA: Diagnosis not present

## 2022-02-11 DIAGNOSIS — D259 Leiomyoma of uterus, unspecified: Secondary | ICD-10-CM | POA: Insufficient documentation

## 2022-02-11 DIAGNOSIS — Z7901 Long term (current) use of anticoagulants: Secondary | ICD-10-CM | POA: Insufficient documentation

## 2022-02-11 DIAGNOSIS — I82462 Acute embolism and thrombosis of left calf muscular vein: Secondary | ICD-10-CM

## 2022-02-11 DIAGNOSIS — Z87891 Personal history of nicotine dependence: Secondary | ICD-10-CM | POA: Diagnosis not present

## 2022-02-11 DIAGNOSIS — D649 Anemia, unspecified: Secondary | ICD-10-CM

## 2022-02-11 DIAGNOSIS — D6859 Other primary thrombophilia: Secondary | ICD-10-CM

## 2022-02-11 DIAGNOSIS — D500 Iron deficiency anemia secondary to blood loss (chronic): Secondary | ICD-10-CM

## 2022-02-11 LAB — CBC WITH DIFFERENTIAL/PLATELET
Abs Immature Granulocytes: 0.02 10*3/uL (ref 0.00–0.07)
Basophils Absolute: 0 10*3/uL (ref 0.0–0.1)
Basophils Relative: 1 %
Eosinophils Absolute: 0 10*3/uL (ref 0.0–0.5)
Eosinophils Relative: 0 %
HCT: 30.4 % — ABNORMAL LOW (ref 36.0–46.0)
Hemoglobin: 9.2 g/dL — ABNORMAL LOW (ref 12.0–15.0)
Immature Granulocytes: 0 %
Lymphocytes Relative: 26 %
Lymphs Abs: 1.8 10*3/uL (ref 0.7–4.0)
MCH: 24.6 pg — ABNORMAL LOW (ref 26.0–34.0)
MCHC: 30.3 g/dL (ref 30.0–36.0)
MCV: 81.3 fL (ref 80.0–100.0)
Monocytes Absolute: 0.8 10*3/uL (ref 0.1–1.0)
Monocytes Relative: 11 %
Neutro Abs: 4.2 10*3/uL (ref 1.7–7.7)
Neutrophils Relative %: 62 %
Platelets: 373 10*3/uL (ref 150–400)
RBC: 3.74 MIL/uL — ABNORMAL LOW (ref 3.87–5.11)
RDW: 19.7 % — ABNORMAL HIGH (ref 11.5–15.5)
WBC: 6.8 10*3/uL (ref 4.0–10.5)
nRBC: 0 % (ref 0.0–0.2)

## 2022-02-11 LAB — IRON AND TIBC
Iron: 17 ug/dL — ABNORMAL LOW (ref 28–170)
Saturation Ratios: 4 % — ABNORMAL LOW (ref 10.4–31.8)
TIBC: 413 ug/dL (ref 250–450)
UIBC: 396 ug/dL

## 2022-02-11 LAB — FERRITIN: Ferritin: 8 ng/mL — ABNORMAL LOW (ref 11–307)

## 2022-02-12 LAB — PROTEIN C ACTIVITY: Protein C Activity: 72 % — ABNORMAL LOW (ref 73–180)

## 2022-02-14 ENCOUNTER — Encounter (INDEPENDENT_AMBULATORY_CARE_PROVIDER_SITE_OTHER): Payer: Self-pay

## 2022-02-14 ENCOUNTER — Inpatient Hospital Stay: Payer: Federal, State, Local not specified - PPO

## 2022-02-14 ENCOUNTER — Encounter: Payer: Self-pay | Admitting: Oncology

## 2022-02-14 ENCOUNTER — Inpatient Hospital Stay (HOSPITAL_BASED_OUTPATIENT_CLINIC_OR_DEPARTMENT_OTHER): Payer: Federal, State, Local not specified - PPO | Admitting: Oncology

## 2022-02-14 VITALS — BP 127/93 | HR 93 | Temp 97.3°F | Wt 239.7 lb

## 2022-02-14 VITALS — BP 133/87 | HR 80 | Resp 16

## 2022-02-14 DIAGNOSIS — D500 Iron deficiency anemia secondary to blood loss (chronic): Secondary | ICD-10-CM

## 2022-02-14 DIAGNOSIS — D259 Leiomyoma of uterus, unspecified: Secondary | ICD-10-CM | POA: Diagnosis not present

## 2022-02-14 DIAGNOSIS — Z86718 Personal history of other venous thrombosis and embolism: Secondary | ICD-10-CM | POA: Diagnosis not present

## 2022-02-14 DIAGNOSIS — I82412 Acute embolism and thrombosis of left femoral vein: Secondary | ICD-10-CM | POA: Diagnosis not present

## 2022-02-14 DIAGNOSIS — Z7901 Long term (current) use of anticoagulants: Secondary | ICD-10-CM | POA: Diagnosis not present

## 2022-02-14 DIAGNOSIS — Z87891 Personal history of nicotine dependence: Secondary | ICD-10-CM | POA: Diagnosis not present

## 2022-02-14 DIAGNOSIS — D508 Other iron deficiency anemias: Secondary | ICD-10-CM | POA: Diagnosis not present

## 2022-02-14 LAB — PROTEIN C, TOTAL: Protein C, Total: 51 % — ABNORMAL LOW (ref 60–150)

## 2022-02-14 MED ORDER — SODIUM CHLORIDE 0.9 % IV SOLN
200.0000 mg | Freq: Once | INTRAVENOUS | Status: AC
  Administered 2022-02-14: 200 mg via INTRAVENOUS
  Filled 2022-02-14: qty 200

## 2022-02-14 MED ORDER — SODIUM CHLORIDE 0.9 % IV SOLN
INTRAVENOUS | Status: DC
  Filled 2022-02-14: qty 250

## 2022-02-14 NOTE — Patient Instructions (Signed)

## 2022-02-14 NOTE — Assessment & Plan Note (Signed)
Continue follow up with Gyn for further management.

## 2022-02-14 NOTE — Assessment & Plan Note (Signed)
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   Hypercoagulable work up showed low protein C activity. Repeat protein C activity improves still lower than normal low end.  protein C antigen is also decreased.  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  I recommend patient to continue Eliquis '5mg'$  BID.

## 2022-02-14 NOTE — Progress Notes (Signed)
Hematology/Oncology Progress note [phone] [phone]        REFERRING PROVIDER: Zeb Comfort, MD   Patient Care Team: Zeb Comfort, MD as PCP - General (Family Medicine) Earlie Server, MD as Consulting Physician (Oncology)  ASSESSMENT & PLAN:   Iron deficiency anemia due to chronic blood loss Severe Iron deficiency anemia,  Labs are reviewed and discussed with patient. Hemoglobin 9.2 ferritin 8, iron saturation 4 Recommend IV venofer weekly x5.   DVT (deep venous thrombosis) (HCC) Provoked extensive lower extremity DVT due to OCP and fibroid uterus.   Hypercoagulable work up showed low protein C activity. Repeat protein C activity improves still lower than normal low end.  protein C antigen is also decreased.  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  I recommend patient to continue Eliquis '5mg'$  BID.    Fibroid uterus Continue follow up with Gyn for further management.    Orders Placed This Encounter  Procedures   CBC with Differential/Platelet    Standing Status:   Future    Standing Expiration Date:   02/14/2023   Ferritin    Standing Status:   Future    Standing Expiration Date:   02/15/2023   Iron and TIBC([facility])    Standing Status:   Future    Standing Expiration Date:   02/15/2023   Protein C activity    Standing Status:   Future    Standing Expiration Date:   02/15/2023   Protein C, total    Standing Status:   Future    Standing Expiration Date:   02/14/2023   Follow up in 3 months    All questions were answered. The patient knows to call the clinic with any problems, questions or concerns. No barriers to learning was detected.  Earlie Server, MD 02/14/2022   CHIEF COMPLAINTS/PURPOSE OF CONSULTATION:  Acute lower extremity DVT, anemia  HISTORY OF PRESENTING ILLNESS:  Debra Jacobs 41 y.o. female present to establish care for acute lower extremity DVT,  anemia.  Patient has fibroid uterus and was recommended by gynecology to start OCP in February/March 2023. 09/07/2021, patient presented emergency room for evaluation of left calf pain.  She was diagnosed with tendinitis.  Patient was referred to see orthopedic surgeon.  Patient was recommended to wear boot 09/29/2021, patient was seen by orthopedic surgeon again.  Patient has had persistent pain and swelling to the left lower extremity.  09/29/2021 ultrasound left lower extremity was obtained and showed extensive thrombus present from the left common femoral vein through the imaged portion of the calf veins.  Thrombus is occlusive along the length of the femoral vein, popliteal vein, and a profunda femoris..  Central extent of thrombus is not clearly established by ultrasound and possibly extends to the pelvis. Patient was started on Eliquis for anticoagulation. 10/05/2021, patient was seen by vascular surgeon Dr. Lucky Cowboy .  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  10/07/21 patient underwent catheter directed thrombolysis and mechanical thrombectomy.  IVC was placed.  Patient tolerates anticoagulation with Eliquis.  Lower extremity swelling improved after procedure.  She wears compression stocking. + Heavy menstrual period due to fibroid uterus.  She is anemic. 05/23/2021, CBC showed hemoglobin of 9.5.  MCV 74. +Fatigue, tired.  Shortness of breath with exertion.  02/12/22 Venous US.  RIGHT: - No evidence of common femoral vein obstruction.  LEFT: - Findings appear improved  from previous examination. - The SFV proximal to mid, and the popliteal vein only show near complete compression with some residual chronic appearing thrombus seen.  INTERVAL HISTORY Debra Jacobs is a 41 y.o. female who has above history reviewed by me today presents for follow up visit for anemia and history DVT.  She tolerates IV venofer in the past and  felt improvement of fatigue.  Heavy menses.  Fatigue is worse lately.    MEDICAL HISTORY:  Past Medical History:  Diagnosis Date   DVT (deep venous thrombosis) ([place])    Dysmenorrhea     SURGICAL HISTORY: Past Surgical History:  Procedure Laterality Date   PERIPHERAL VASCULAR THROMBECTOMY Left 10/07/2021   Procedure: PERIPHERAL VASCULAR THROMBECTOMY;  Surgeon: Algernon Huxley, MD;  Location: [facility];  Service: Cardiovascular;  Laterality: Left;    SOCIAL HISTORY: Social History   Socioeconomic History   Marital status: Single    Spouse name: Not on file   Number of children: Not on file   Years of education: Not on file   Highest education level: Not on file  Occupational History   Not on file  Tobacco Use   Smoking status: Some Days   Smokeless tobacco: Never   Tobacco comments:    Pt smokes Hookah  Vaping Use   Vaping Use: Former  Substance and Sexual Activity   Alcohol use: Not Currently    Comment: rare   Drug use: Never   Sexual activity: Not on file  Other Topics Concern   Not on file  Social History Narrative   Not on file   Social Determinants of Health   Financial Resource Strain: Not on file  Food Insecurity: Not on file  Transportation Needs: Not on file  Physical Activity: Not on file  Stress: Not on file  Social Connections: Not on file  Intimate Partner Violence: Not on file    FAMILY HISTORY: Family History  Problem Relation Age of Onset   Hypertension Mother    Hypertension Father    Nephrolithiasis Father    Hypertension Brother    Multiple sclerosis Brother    Deep vein thrombosis Maternal Aunt    Liver disease Maternal Grandfather     ALLERGIES:  is allergic to etonogestrel-ethinyl estradiol, erythromycin, and sulfa antibiotics.  MEDICATIONS:  Current Outpatient Medications  Medication Sig Dispense Refill   ELIQUIS 5 MG TABS tablet Take 1 tablet (5 mg total) by mouth 2 (two) times daily. 60 tablet 6    hydrochlorothiazide (HYDRODIURIL) 25 MG tablet Take 1 tablet by mouth daily.     ibuprofen (ADVIL) 800 MG tablet Take 1 tablet (800 mg total) by mouth every 8 (eight) hours as needed. (Patient not taking: Reported on 02/14/2022) 45 tablet 0   KARIVA 0.15-0.02/0.01 MG (21/5) tablet Take 1 tablet by mouth daily. (Patient not taking: Reported on 11/10/2021)     predniSONE (DELTASONE) 20 MG tablet Take 2 tablets (40 mg total) by mouth daily. (Patient not taking: Reported on 10/05/2021) 10 tablet 0   No current facility-administered medications for this visit.    Review of Systems  Constitutional:  Positive for fatigue. Negative for appetite change, chills and fever.  HENT:   Negative for hearing loss and voice change.   Eyes:  Negative for eye problems.  Respiratory:  Negative for chest tightness and cough.   Cardiovascular:  Negative for chest pain.  Gastrointestinal:  Negative for abdominal distention, abdominal pain and blood in stool.  Endocrine: Negative  for hot flashes.  Genitourinary:  Positive for menstrual problem. Negative for difficulty urinating and frequency.   Musculoskeletal:  Negative for arthralgias.  Skin:  Negative for itching and rash.  Neurological:  Negative for extremity weakness.  Hematological:  Negative for adenopathy.  Psychiatric/Behavioral:  Negative for confusion.      PHYSICAL EXAMINATION: ECOG PERFORMANCE STATUS: 1 - Symptomatic but completely ambulatory  Vitals:   02/14/22 1425  BP: (!) 127/93  Pulse: 93  Temp: (!) 97.3 F (36.3 C)  SpO2: 98%   Filed Weights   02/14/22 1425  Weight: 239 lb 11.2 oz (108.7 kg)    Physical Exam Constitutional:      General: She is not in acute distress.    Appearance: She is obese. She is not diaphoretic.  HENT:     Head: Normocephalic and atraumatic.     Nose: Nose normal.     Mouth/Throat:     Pharynx: No oropharyngeal exudate.  Eyes:     General: No scleral icterus.    Pupils: Pupils are equal, round, and  reactive to light.  Cardiovascular:     Rate and Rhythm: Normal rate.     Heart sounds: No murmur heard. Pulmonary:     Effort: Pulmonary effort is normal. No respiratory distress.     Breath sounds: No rales.  Chest:     Chest wall: No tenderness.  Abdominal:     General: There is no distension.     Palpations: Abdomen is soft.     Tenderness: There is no abdominal tenderness.  Musculoskeletal:        General: Normal range of motion.     Cervical back: Normal range of motion and neck supple.  Skin:    General: Skin is warm and dry.     Findings: No erythema.  Neurological:     Mental Status: She is alert and oriented to person, place, and time. Mental status is at baseline.     Cranial Nerves: No cranial nerve deficit.     Motor: No abnormal muscle tone.  Psychiatric:        Mood and Affect: Mood and affect normal.      LABORATORY DATA:  I have reviewed the data as listed     Latest Ref Rng & Units 02/11/2022   12:57 PM 12/23/2021   11:44 AM 12/09/2021   12:07 PM  CBC  WBC 4.0 - 10.5 K/uL 6.8  7.7  9.1   Hemoglobin 12.0 - 15.0 g/dL 9.2  9.5  11.3   Hematocrit 36.0 - 46.0 % 30.4  31.3  38.1   Platelets 150 - 400 K/uL 373  512  375       Latest Ref Rng & Units 11/10/2021    3:43 PM 10/07/2021    1:25 PM  CMP  Glucose 70 - 99 mg/dL 104    BUN 6 - 20 mg/dL 11  8   Creatinine 0.44 - 1.00 mg/dL 0.64  0.72   Sodium 135 - 145 mmol/L 134    Potassium 3.5 - 5.1 mmol/L 3.1    Chloride 98 - 111 mmol/L 104    CO2 22 - 32 mmol/L 26    Calcium 8.9 - 10.3 mg/dL 8.5    Total Protein 6.5 - 8.1 g/dL 7.3    Total Bilirubin 0.3 - 1.2 mg/dL 0.3    Alkaline Phos 38 - 126 U/L 66    AST 15 - 41 U/L 22    ALT 0 -  44 U/L 16     Lab Results  Component Value Date   IRON 17 (L) 02/11/2022   TIBC 413 02/11/2022   FERRITIN 8 (L) 02/11/2022      RADIOGRAPHIC STUDIES: I have personally reviewed the radiological images as listed and agreed with the findings in the report. VAS [place] LOWER  EXTREMITY VENOUS (DVT)  Result Date: 02/02/2022  Lower Venous DVT Study Patient Name:  JANANN BOEVE  Date of Exam:   02/02/2022 Medical Rec #: [id number]         Accession #:    [id number] Date of Birth: 11/17/1980          Patient Gender: F Patient Age:   24 years Exam Location:  [facility] Procedure:      VAS [place] LOWER EXTREMITY VENOUS (DVT) Referring Phys: Eulogio Ditch --------------------------------------------------------------------------------  Indications: Pain, and occasional left leg.  Risk Factors: DVT Hx of DVT left leg. Comparison Study: 10/2021 Performing Technologist: Concha Norway RVT  Examination Guidelines: A complete evaluation includes B-mode imaging, spectral Doppler, color Doppler, and power Doppler as needed of all accessible portions of each vessel. Bilateral testing is considered an integral part of a complete examination. Limited examinations for reoccurring indications may be performed as noted. The reflux portion of the exam is performed with the patient in reverse Trendelenburg.  +------+---------------+---------+-----------+----------+--------------+ RIGHT CompressibilityPhasicitySpontaneityPropertiesThrombus Aging +------+---------------+---------+-----------+----------+--------------+ CFV   Full           Yes      Yes                                 +------+---------------+---------+-----------+----------+--------------+ SFJ   Full           Yes      Yes                                 +------+---------------+---------+-----------+----------+--------------+ FV MidFull           Yes      Yes                                 +------+---------------+---------+-----------+----------+--------------+  +---------+---------------+---------+-----------+----------+--------------+ LEFT     CompressibilityPhasicitySpontaneityPropertiesThrombus Aging +---------+---------------+---------+-----------+----------+--------------+ CFV      Full            Yes      Yes                                 +---------+---------------+---------+-----------+----------+--------------+ SFJ      Full           Yes      Yes                                 +---------+---------------+---------+-----------+----------+--------------+ FV Prox  Partial        Yes      Yes                  Chronic        +---------+---------------+---------+-----------+----------+--------------+ FV Mid   Partial        Yes      Yes  Chronic        +---------+---------------+---------+-----------+----------+--------------+ FV DistalFull           Yes      Yes                                 +---------+---------------+---------+-----------+----------+--------------+ PFV      Full           Yes      Yes                                 +---------+---------------+---------+-----------+----------+--------------+ POP      Partial        Yes      Yes                  Chronic        +---------+---------------+---------+-----------+----------+--------------+ PTV      Full           Yes      Yes                                 +---------+---------------+---------+-----------+----------+--------------+ PERO     Full           Yes      Yes                                 +---------+---------------+---------+-----------+----------+--------------+ Gastroc  Full           Yes      Yes                                 +---------+---------------+---------+-----------+----------+--------------+ GSV      Full           Yes      Yes                                 +---------+---------------+---------+-----------+----------+--------------+  Summary: RIGHT: - No evidence of common femoral vein obstruction.  LEFT: - Findings appear improved from previous examination. - The SFV proximal to mid, and the popliteal vein only show near complete compression with some residual chronic appearing thrombus seen. Recannalized flow throughout.  *See  table(s) above for measurements and observations. Electronically signed by Leotis Pain MD on 02/02/2022 at 4:20:11 PM.    Final

## 2022-02-14 NOTE — Progress Notes (Signed)
Pt tolerated iron infusion without complaints.  VSS.  Pt denied 30 minute post observation  

## 2022-02-14 NOTE — Assessment & Plan Note (Addendum)
Severe Iron deficiency anemia,  Labs are reviewed and discussed with patient. Hemoglobin 9.2 ferritin 8, iron saturation 4 Recommend IV venofer weekly x5.

## 2022-02-21 ENCOUNTER — Inpatient Hospital Stay: Payer: Federal, State, Local not specified - PPO

## 2022-02-23 ENCOUNTER — Inpatient Hospital Stay: Payer: Federal, State, Local not specified - PPO | Attending: Oncology

## 2022-02-23 VITALS — BP 137/95 | HR 88 | Resp 16

## 2022-02-23 DIAGNOSIS — N920 Excessive and frequent menstruation with regular cycle: Secondary | ICD-10-CM | POA: Diagnosis not present

## 2022-02-23 DIAGNOSIS — D500 Iron deficiency anemia secondary to blood loss (chronic): Secondary | ICD-10-CM | POA: Insufficient documentation

## 2022-02-23 MED ORDER — SODIUM CHLORIDE 0.9 % IV SOLN
INTRAVENOUS | Status: DC
  Filled 2022-02-23: qty 250

## 2022-02-23 MED ORDER — SODIUM CHLORIDE 0.9 % IV SOLN
200.0000 mg | Freq: Once | INTRAVENOUS | Status: AC
  Administered 2022-02-23: 200 mg via INTRAVENOUS
  Filled 2022-02-23: qty 200

## 2022-02-23 NOTE — Progress Notes (Signed)
Pt tolerated iron infusion without complaints.  VSS.  Pt refused to wait 30 minute post observation.  Pt instructed to present to ED if any signs or symptoms of reaction occurred.  Pt verbalized understanding.

## 2022-02-23 NOTE — Patient Instructions (Addendum)

## 2022-02-28 ENCOUNTER — Inpatient Hospital Stay: Payer: Federal, State, Local not specified - PPO

## 2022-02-28 VITALS — BP 136/85 | HR 80 | Temp 98.4°F | Resp 16

## 2022-02-28 DIAGNOSIS — D500 Iron deficiency anemia secondary to blood loss (chronic): Secondary | ICD-10-CM

## 2022-02-28 DIAGNOSIS — N920 Excessive and frequent menstruation with regular cycle: Secondary | ICD-10-CM | POA: Diagnosis not present

## 2022-02-28 MED ORDER — SODIUM CHLORIDE 0.9 % IV SOLN
INTRAVENOUS | Status: DC
  Filled 2022-02-28: qty 250

## 2022-02-28 MED ORDER — SODIUM CHLORIDE 0.9 % IV SOLN
200.0000 mg | Freq: Once | INTRAVENOUS | Status: AC
  Administered 2022-02-28: 200 mg via INTRAVENOUS
  Filled 2022-02-28: qty 200

## 2022-02-28 NOTE — Patient Instructions (Signed)

## 2022-02-28 NOTE — Progress Notes (Signed)
Tolerated venofer infusion. VSS at discharge. Refused to stay for 30 minute post-infusion observation.

## 2022-03-04 MED FILL — Iron Sucrose Inj 20 MG/ML (Fe Equiv): INTRAVENOUS | Qty: 10 | Status: AC

## 2022-03-07 ENCOUNTER — Inpatient Hospital Stay: Payer: Federal, State, Local not specified - PPO

## 2022-03-07 VITALS — BP 136/89 | HR 83 | Resp 16

## 2022-03-07 DIAGNOSIS — D500 Iron deficiency anemia secondary to blood loss (chronic): Secondary | ICD-10-CM | POA: Diagnosis not present

## 2022-03-07 DIAGNOSIS — N920 Excessive and frequent menstruation with regular cycle: Secondary | ICD-10-CM | POA: Diagnosis not present

## 2022-03-07 MED ORDER — SODIUM CHLORIDE 0.9 % IV SOLN
200.0000 mg | Freq: Once | INTRAVENOUS | Status: AC
  Administered 2022-03-07: 200 mg via INTRAVENOUS
  Filled 2022-03-07: qty 200

## 2022-03-07 MED ORDER — SODIUM CHLORIDE 0.9 % IV SOLN
INTRAVENOUS | Status: DC
  Filled 2022-03-07: qty 250

## 2022-03-07 NOTE — Patient Instructions (Signed)

## 2022-03-07 NOTE — Progress Notes (Signed)
Pt tolerated treatment without complaints.  VSS.  Pt refused 30 minutes post observation

## 2022-03-14 ENCOUNTER — Inpatient Hospital Stay: Payer: Federal, State, Local not specified - PPO

## 2022-03-14 VITALS — BP 128/89 | HR 90 | Temp 98.2°F | Resp 18

## 2022-03-14 DIAGNOSIS — N920 Excessive and frequent menstruation with regular cycle: Secondary | ICD-10-CM | POA: Diagnosis not present

## 2022-03-14 DIAGNOSIS — D500 Iron deficiency anemia secondary to blood loss (chronic): Secondary | ICD-10-CM

## 2022-03-14 MED ORDER — SODIUM CHLORIDE 0.9 % IV SOLN
200.0000 mg | Freq: Once | INTRAVENOUS | Status: AC
  Administered 2022-03-14: 200 mg via INTRAVENOUS
  Filled 2022-03-14: qty 200

## 2022-03-14 MED ORDER — SODIUM CHLORIDE 0.9 % IV SOLN
INTRAVENOUS | Status: DC
  Filled 2022-03-14: qty 250

## 2022-03-14 NOTE — Progress Notes (Signed)
Pt doesn't stay the 30 min after Iron but understands and was educated.

## 2022-04-04 ENCOUNTER — Telehealth: Payer: Self-pay

## 2022-04-04 NOTE — Telephone Encounter (Signed)
Called pt to confirm appointment for tomorrow.  Pt verified appt date/time.   

## 2022-04-05 ENCOUNTER — Encounter: Payer: Self-pay | Admitting: Obstetrics and Gynecology

## 2022-04-05 ENCOUNTER — Ambulatory Visit (INDEPENDENT_AMBULATORY_CARE_PROVIDER_SITE_OTHER): Payer: Federal, State, Local not specified - PPO | Admitting: Obstetrics and Gynecology

## 2022-04-05 VITALS — BP 148/87 | HR 94 | Ht 66.0 in | Wt 242.0 lb

## 2022-04-05 DIAGNOSIS — R87619 Unspecified abnormal cytological findings in specimens from cervix uteri: Secondary | ICD-10-CM

## 2022-04-05 DIAGNOSIS — N920 Excessive and frequent menstruation with regular cycle: Secondary | ICD-10-CM

## 2022-04-05 DIAGNOSIS — D219 Benign neoplasm of connective and other soft tissue, unspecified: Secondary | ICD-10-CM | POA: Diagnosis not present

## 2022-04-05 NOTE — Progress Notes (Signed)
GYNECOLOGY OFFICE VISIT NOTE  History:   Debra Jacobs is a 41 y.o. G0 here today for HVB in setting of fibroids.   She had heavy periods before and went on OCPs in February from her PCP. She then had a blood clot and came off the pills and now has heavy bleeding again on Eliquis.  She saw a gynecologist in August who did an endometrial biopsy which is negative through [facility]. 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    She had an [place] through [facility] and report is available:  The uterus is retroverted. It measures 10.9 x 6.2 x 6.9 cm cm. Multiple  fibroids as described below. The endometrium measures 0.4 cm a small amount  of fluid within the endometrial canal.   1: Posterior body, measuring 5.2 x 4.8 x 5.4 cm with submucosal component.  2: Posterior fundus measuring 2.5 x 2.6 x 2.0 cm, with submucosal component  3: Anterior body, measuring 2.8 x 3.1 x 2.5 cm, with submucosal component.  4: Central fundus, measuring 2.5 x 2.6 x 2.9 cm, with submucosal component  where it splays the endometrium at the area of small amount of fluid within  the endometrial canal.   She does not think she wants children but she had not formally considering removing the option.   Reviewed CMA note: Heavy bleeding with cycle pt has to uses Depends pampers, period panties, Ultra Tampons  LMP: 04/02/22 last 7-10 days then spotting the rest of the month.    Patient had to have 1 Blood Transfusions and 10 iron infusions.    Past Medical History:  Diagnosis Date   DVT (deep venous thrombosis) (HCC)    Dysmenorrhea     Past Surgical History:  Procedure Laterality Date   PERIPHERAL VASCULAR THROMBECTOMY Left 10/07/2021   Procedure: PERIPHERAL VASCULAR THROMBECTOMY;  Surgeon: Algernon Huxley, MD;  Location: [facility];  Service: Cardiovascular;  Laterality: Left;   The following portions of the patient's history were reviewed and updated as appropriate: allergies, current medications, past family history, past  medical history, past social history, past surgical history and problem list.   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  Prior pap in 2015 was pap normal and HPV negative.   Review of Systems:  Pertinent items noted in HPI and remainder of comprehensive ROS otherwise negative.  Physical Exam:  BP (!) 148/87   Pulse 94   Ht '5\' 6"'$  (1.676 m)   Wt 242 lb (109.8 kg)   LMP 04/02/2022   BMI 39.06 kg/m  CONSTITUTIONAL: Well-developed, well-nourished female in no acute distress.  HEENT:  Normocephalic, atraumatic. External right and left ear normal. No scleral icterus.  NECK: Normal range of motion, supple, no masses noted on observation SKIN: No rash noted. Not diaphoretic. No erythema. No pallor. MUSCULOSKELETAL: Normal range of motion. No edema noted. NEUROLOGIC: Alert and oriented to person, place, and time. Normal muscle tone coordination. No cranial nerve deficit noted. PSYCHIATRIC: Normal mood and affect. Normal behavior. Normal judgment and thought content.  CARDIOVASCULAR: Normal heart rate noted RESPIRATORY: Effort and breath sounds normal, no problems with respiration noted ABDOMEN: No masses noted. No other overt distention noted.    PELVIC: Deferred  Labs and Imaging No results found for this or any previous visit (from the past 168 hour(s)). No results found.  Assessment and Plan:   1. Fibroids - Uterine fibroids: The patient's fibroids are symptomatic and treatment options of expectant  management, medical therapy, and surgical therapy were discussed. - Expectant management - The patient's fibroids were discussed and expectant management was offered with strict precautions. - We discussed progesterone only options including - POP, Depo Provera and Lng-IUD.  We reviewed risks and benefits and proper use. Progesterone Only Birth Control Pills (POP)- The use of progesterone only birth control pills was discussed with the patient. I will  check with her hematologist to make sure they are comfortable with this option but reviewed with her there is no estrogen so this would be a reasonable temporizing option. The control of fibroid symptoms was discussed and the risks/benefits of therapy were discussed.  Proper use was also discussed. We reviewed possible induction of amenorrhea with each options as well as the possibility of break through bleeding. I would view POP as a bridge to other options I.e. LNG or procedure based option.  - We discussed the GnRH-antagonists. I would avoid Myf[place]ree in her due to add-back.  - We discussed surgical/procedural options available: RFA (I.e. Sonata), [place], myomectomy and hysterectomy as well as endometrial ablation. We discussed the risks and benefits for each of these specific procedures.  We discussed the types and sizes of fibroids that are candidates for hysteroscopic resection of fibroids as well and she does not fall into that category. We discussed the risks and recovery for each type of procedure.  If she chooses, [place], Sonata or myomectomy, I recommend pelvic MRI for imaging. If she chooses endometrial ablation, I would consider repeat US where we can review the images. If she chooses a procedure she understands it would not be me doing it, I would arrange her to do a preop appt with a partner.  - Following counseling, the patient would like to POPs while she considers her options. I will contact her once I hear from Dr. Tasia Catchings. I have sent a message.   2. Menorrhagia with regular cycle - She is s/p EMB which was benign in August 2023 [facility].   3. Abnormal cervical Papanicolaou smear, unspecified abnormal pap finding - Recommend pap smear and HPV testing in one year.    Routine preventative health maintenance measures emphasized. Please refer to After Visit Summary for other counseling recommendations.   Return if symptoms worsen or fail to improve.  Radene Gunning, MD,  [place] for [facility], [place]

## 2022-04-05 NOTE — Progress Notes (Signed)
New GYN patient here for problem visit.  CC: Heavy bleeding with cycle pt has to uses Depends pampers, period panties  /Ultra Tampons.   LMP: 04/02/22 last 7-10 days then spotting the rest of the month.   Went on [employer] in 05/2021 caused a blood clots now on blood thinners.   Patient had to have 1 Blood Transfusions and 10 iron infusions.  Last pap: 12/01/21 + HPV ASCUS  Fun Fact: Patient loves listening to live music.

## 2022-04-05 NOTE — Patient Instructions (Addendum)
Hormonal Options Progesterone only pill - Norethindrone Depo provera (not ideal) Mirena IUD Aygestin Provera Megace (not preferred unless acute bleeding)  Anti-Hormone options Elagolix (preferred) Relagolix, not preferred due to estrogen in it  Procedural Endometrial ablation - HTA, hydrothermal ablation Sonata - [place] radiofrequency ablation Uterine artery embolization Hysterectomy - removal of uterus/cervix/tubes Myomectomy - only fertility sparing option  If you decide on 2, 3, 5 from Procedure category, I recommend a pelvic MRI.

## 2022-04-07 ENCOUNTER — Encounter: Payer: Self-pay | Admitting: Obstetrics and Gynecology

## 2022-04-07 DIAGNOSIS — D219 Benign neoplasm of connective and other soft tissue, unspecified: Secondary | ICD-10-CM

## 2022-04-07 DIAGNOSIS — N920 Excessive and frequent menstruation with regular cycle: Secondary | ICD-10-CM

## 2022-04-08 MED ORDER — NORETHINDRONE 0.35 MG PO TABS: 1.0000 | ORAL_TABLET | Freq: Every day | ORAL | 3 refills | Status: DC

## 2022-04-27 ENCOUNTER — Other Ambulatory Visit (INDEPENDENT_AMBULATORY_CARE_PROVIDER_SITE_OTHER): Payer: Self-pay | Admitting: Nurse Practitioner

## 2022-04-27 DIAGNOSIS — I82412 Acute embolism and thrombosis of left femoral vein: Secondary | ICD-10-CM

## 2022-05-04 ENCOUNTER — Ambulatory Visit (INDEPENDENT_AMBULATORY_CARE_PROVIDER_SITE_OTHER): Payer: Federal, State, Local not specified - PPO | Admitting: Nurse Practitioner

## 2022-05-04 ENCOUNTER — Encounter (INDEPENDENT_AMBULATORY_CARE_PROVIDER_SITE_OTHER): Payer: Federal, State, Local not specified - PPO

## 2022-05-13 ENCOUNTER — Ambulatory Visit
Admission: EM | Admit: 2022-05-13 | Discharge: 2022-05-13 | Disposition: A | Discharge status: Home / Self Care | Payer: Federal, State, Local not specified - PPO | Attending: Family Medicine | Admitting: Family Medicine

## 2022-05-13 DIAGNOSIS — S0502XA Injury of conjunctiva and corneal abrasion without foreign body, left eye, initial encounter: Secondary | ICD-10-CM

## 2022-05-13 DIAGNOSIS — D500 Iron deficiency anemia secondary to blood loss (chronic): Secondary | ICD-10-CM

## 2022-05-13 DIAGNOSIS — Z7901 Long term (current) use of anticoagulants: Secondary | ICD-10-CM

## 2022-05-13 LAB — CBC WITH DIFFERENTIAL/PLATELET
Abs Immature Granulocytes: 0.04 10*3/uL (ref 0.00–0.07)
Basophils Absolute: 0 10*3/uL (ref 0.0–0.1)
Basophils Relative: 1 %
Eosinophils Absolute: 0 10*3/uL (ref 0.0–0.5)
Eosinophils Relative: 0 %
HCT: 27.8 % — ABNORMAL LOW (ref 36.0–46.0)
Hemoglobin: 8.2 g/dL — ABNORMAL LOW (ref 12.0–15.0)
Immature Granulocytes: 1 %
Lymphocytes Relative: 19 %
Lymphs Abs: 1.6 10*3/uL (ref 0.7–4.0)
MCH: 23.4 pg — ABNORMAL LOW (ref 26.0–34.0)
MCHC: 29.5 g/dL — ABNORMAL LOW (ref 30.0–36.0)
MCV: 79.4 fL — ABNORMAL LOW (ref 80.0–100.0)
Monocytes Absolute: 1.1 10*3/uL — ABNORMAL HIGH (ref 0.1–1.0)
Monocytes Relative: 13 %
Neutro Abs: 5.6 10*3/uL (ref 1.7–7.7)
Neutrophils Relative %: 66 %
Platelets: 515 10*3/uL — ABNORMAL HIGH (ref 150–400)
RBC: 3.5 MIL/uL — ABNORMAL LOW (ref 3.87–5.11)
RDW: 19.5 % — ABNORMAL HIGH (ref 11.5–15.5)
WBC: 8.4 10*3/uL (ref 4.0–10.5)
nRBC: 0 % (ref 0.0–0.2)

## 2022-05-13 MED ORDER — POLYMYXIN B-TRIMETHOPRIM 10000-0.1 UNIT/ML-% OP SOLN: 2.0000 [drp] | Freq: Four times a day (QID) | OPHTHALMIC | 0 refills | Status: AC

## 2022-05-13 NOTE — ED Triage Notes (Signed)
Pt c/o L eye swelling & irritation x1 day, concerned about an allergic rxn. Denies any blurry vision or pain.

## 2022-05-13 NOTE — Discharge Instructions (Addendum)
You have evidence of a corneal abrasion. Stop by the pharmacy to pick up your antibiotic drops.  Follow up with your primary care provider, as needed.  Pick up some hydrocortisone at the pharmacy and apply to your eyelid.    Your hemoglobin is stable at 8.2.  You continue to have some evidence of iron deficiency anemia.  If your dizziness or shortness of breath worsens, please go to the emergency department for follow-up.  Be sure to follow-up with your hematologist.

## 2022-05-13 NOTE — ED Provider Notes (Signed)
[facility]    CSN: [id number] Arrival date & time: 05/13/22  1116      History   Chief Complaint Chief Complaint  Patient presents with   Eye Problem   Allergic Reaction    HPI HPI  Debra Jacobs is a 42 y.o. female.    Debra Jacobs presents for left eye swelling and irritation that occurred yesterday while at work.  She states that around 4:00 it started itching and then it started swelling later that day.  She felt like something was in her eye but did not see anything.  She has not had any pain or blurry vision.  She woke up this morning and her eye was swollen and denies any crusting or discharge.  She is not having any trouble seeing.  She wears glasses but no contacts.  She has had some intermittent dizziness and reports history of anemia.  Says that her menses just went off last week.  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  Denies any chest pain or shortness of breath.     Past Medical History:  Diagnosis Date   DVT (deep venous thrombosis) ([place])    Dysmenorrhea     Patient Active Problem List   Diagnosis Date Noted   Iron deficiency anemia due to chronic blood loss 11/10/2021   DVT (deep venous thrombosis) ([place]) 10/05/2021   Fibroid uterus 10/05/2021   Anemia 10/05/2021    Past Surgical History:  Procedure Laterality Date   PERIPHERAL VASCULAR THROMBECTOMY Left 10/07/2021   Procedure: PERIPHERAL VASCULAR THROMBECTOMY;  Surgeon: Algernon Huxley, MD;  Location: [facility];  Service: Cardiovascular;  Laterality: Left;    OB History   No obstetric history on file.    Obstetric Comments  LMP - currently           Home Medications    Prior to Admission medications   Medication Sig Start Date End Date Taking? Authorizing Provider  ELIQUIS 5 MG TABS tablet Take 1 tablet (5 mg total) by mouth 2 (two) times daily. 11/01/21  Yes Kris Hartmann, NP  norethindrone (MICRONOR) 0.35 MG tablet Take 1 tablet (0.35 mg total) by mouth  daily. 04/08/22  Yes Radene Gunning, MD  trimethoprim-polymyxin b (POLYTRIM) ophthalmic solution Place 2 drops into the left eye every 6 (six) hours. 05/13/22  Yes Amyre Segundo, DO  hydrochlorothiazide (HYDRODIURIL) 25 MG tablet Take 1 tablet by mouth daily. Patient not taking: Reported on 04/05/2022 11/17/21   [provider]  ibuprofen (ADVIL) 800 MG tablet Take 1 tablet (800 mg total) by mouth every 8 (eight) hours as needed. Patient not taking: Reported on 02/14/2022 09/30/21   Algernon Huxley, MD  KARIVA 0.15-0.02/0.01 MG (21/5) tablet Take 1 tablet by mouth daily. Patient not taking: Reported on 11/10/2021 08/24/21   [provider]  predniSONE (DELTASONE) 20 MG tablet Take 2 tablets (40 mg total) by mouth daily. Patient not taking: Reported on 10/05/2021 09/07/21   Hans Eden, NP    Family History Family History  Problem Relation Age of Onset   Hypertension Mother    Hypertension Father    Nephrolithiasis Father    Hypertension Brother    Multiple sclerosis Brother    Deep vein thrombosis Maternal Aunt    Liver disease Maternal Grandfather     Social History Social History   Tobacco Use   Smoking status: Some Days   Smokeless tobacco: Never   Tobacco comments:  Pt smokes Hookah  Vaping Use   Vaping Use: Former  Substance Use Topics   Alcohol use: Not Currently    Comment: rare   Drug use: Never     Allergies   Etonogestrel-ethinyl estradiol, Erythromycin, and Sulfa antibiotics   Review of Systems Review of Systems : negative unless otherwise stated in HPI.      Physical Exam Triage Vital Signs ED Triage Vitals  Enc Vitals Group     BP 05/13/22 1258 (!) 140/93     Pulse Rate 05/13/22 1258 90     Resp 05/13/22 1258 16     Temp 05/13/22 1258 98.1 F (36.7 C)     Temp Source 05/13/22 1258 Oral     SpO2 05/13/22 1258 97 %     Weight 05/13/22 1256 236 lb (107 kg)     Height 05/13/22 1256 '5\' 6"'$  (1.676 m)     Head Circumference --       Peak Flow --      Pain Score 05/13/22 1256 0     Pain Loc --      Pain Edu? --      Excl. in [place]? --    No data found.  Updated Vital Signs BP (!) 140/93 (BP Location: Left Arm)   Pulse 90   Temp 98.1 F (36.7 C) (Oral)   Resp 16   Ht '5\' 6"'$  (1.676 m)   Wt 107 kg   LMP 04/29/2022 (Approximate)   SpO2 97%   BMI 38.09 kg/m   Visual Acuity Right Eye Distance: 20/20 Left Eye Distance: 20/20 Bilateral Distance: 20/15 (With correction)  Right Eye Near:   Left Eye Near:    Bilateral Near:     Physical Exam  GEN: pleasant well appearing female, in no acute distress  NECK: normal ROM  CV: regular rate and rhythm, no murmurs appreciated  RESP: no increased work of breathing, clear to ascultation bilaterally EYES:     General: Lower lid pallor lids are everted, no foreign bodies appreciated. Vision grossly intact. Gaze aligned appropriately.        Right eye: No discharge.        Left eye: No foreign body, discharge or hordeolum.     Extraocular Movements: Extraocular movements intact.     Conjunctiva/sclera:     Left eye: Left conjunctiva is pale, no chemosis or hemorrhage.    Comments: fluorescein stain performed, Corneal abrasions in the 8 through 10 o'clock position and 3 through 5 o'clock positions, saline rinsed and inspected for foreign bodies  SKIN: warm and dry   UC Treatments / Results  Labs (all labs ordered are listed, but only abnormal results are displayed) Labs Reviewed  CBC WITH DIFFERENTIAL/PLATELET - Abnormal; Notable for the following components:      Result Value   RBC 3.50 (*)    Hemoglobin 8.2 (*)    HCT 27.8 (*)    MCV 79.4 (*)    MCH 23.4 (*)    MCHC 29.5 (*)    RDW 19.5 (*)    Platelets 515 (*)    Monocytes Absolute 1.1 (*)    All other components within normal limits    EKG   Radiology No results found.  Procedures Procedures (including critical care time)  Medications Ordered in UC Medications - No data to display  Initial  Impression / Assessment and Plan / UC Course  I have reviewed the triage vital signs and the nursing notes.  Pertinent labs &  imaging results that were available during my care of the patient were reviewed by me and considered in my medical decision making (see chart for details).     Patient is a 42 y.o. female who presents after left swelling and irritation for the past day.  Vital signs stable.  On exam, she has a corneal abrasion seen on fluorescein stain.  Sent Polytrim antibiotics to the pharmacy.    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  On chart review she also has fibroids and recently went off of her period.  Patient's last menstrual period was 04/29/2022 (approximate).  History of mild intermittent dizziness and lightheadedness.  She has conjunctival pallor and lower lid pallor.  Discussed obtaining a CBC to check for worsening anemia and she is agreeable.  She has a stable hemoglobin at 8.2.  Follows with heme-onc, Dr.Yu and vascular surgery, Dr. Lucky Cowboy.  Reviewed most recent office visits.  Last iron infusion was in late November.  Had to have an blood transfusion September/October 2023.  Advised to follow-up with her hematologist. Discussed MDM, treatment plan and plan for follow-up with patient who agrees with plan.  Final Clinical Impressions(s) / UC Diagnoses   Final diagnoses:  Abrasion of left cornea, initial encounter  Chronic anticoagulation  Iron deficiency anemia due to chronic blood loss     Discharge Instructions      You have evidence of a corneal abrasion. Stop by the pharmacy to pick up your antibiotic drops.  Follow up with your primary care provider, as needed.  Pick up some hydrocortisone at the pharmacy and apply to your eyelid.    Your hemoglobin is stable at 8.2.  You continue to have some evidence of iron deficiency anemia.  If your dizziness or shortness of breath worsens, please go to the emergency department  for follow-up.  Be sure to follow-up with your hematologist.      ED Prescriptions     Medication Sig Dispense Auth. Provider   trimethoprim-polymyxin b (POLYTRIM) ophthalmic solution Place 2 drops into the left eye every 6 (six) hours. 10 mL Lyndee Hensen, DO      PDMP not reviewed this encounter.   Lyndee Hensen, DO 05/13/22 1421

## 2022-05-17 ENCOUNTER — Inpatient Hospital Stay: Payer: Federal, State, Local not specified - PPO

## 2022-05-20 ENCOUNTER — Ambulatory Visit
Admission: RE | Admit: 2022-05-20 | Discharge: 2022-05-20 | Disposition: A | Discharge status: Home / Self Care | Payer: Federal, State, Local not specified - PPO | Source: Ambulatory Visit | Attending: Internal Medicine | Admitting: Internal Medicine

## 2022-05-20 VITALS — BP 132/85 | HR 102 | Temp 98.8°F | Resp 14 | Ht 66.0 in | Wt 235.9 lb

## 2022-05-20 DIAGNOSIS — H0014 Chalazion left upper eyelid: Secondary | ICD-10-CM | POA: Diagnosis not present

## 2022-05-20 MED ORDER — CEPHALEXIN 500 MG PO CAPS: 1000.0000 mg | ORAL_CAPSULE | Freq: Two times a day (BID) | ORAL | 0 refills | Status: DC

## 2022-05-20 NOTE — ED Triage Notes (Signed)
Patient was seen here on 05/14/22 for left eye cornea abrasion.  Patient states that she was using the eye drops prescribed.  Patient reports tenderness and swelling in her left eye lid that started on Friday.  Patient reports some eye drainage.

## 2022-05-20 NOTE — ED Provider Notes (Signed)
[facility]    CSN: [id number] Arrival date & time: 05/20/22  1600      History   Chief Complaint Chief Complaint  Patient presents with   Eye Problem    left    HPI Debra Jacobs is a 42 y.o. female who presents with unresolved swelling and lump of L upper lid. Was seen last week for corneal abrasion and lid swelling and has used the Polytrim. Today the edge of her L eye lid where the lump is, has been draining yellow matter. Denies photophobia, red eyes or tearing.     Past Medical History:  Diagnosis Date   DVT (deep venous thrombosis) ([place])    Dysmenorrhea     Patient Active Problem List   Diagnosis Date Noted   Iron deficiency anemia due to chronic blood loss 11/10/2021   DVT (deep venous thrombosis) ([place]) 10/05/2021   Fibroid uterus 10/05/2021   Anemia 10/05/2021    Past Surgical History:  Procedure Laterality Date   PERIPHERAL VASCULAR THROMBECTOMY Left 10/07/2021   Procedure: PERIPHERAL VASCULAR THROMBECTOMY;  Surgeon: Algernon Huxley, MD;  Location: [facility];  Service: Cardiovascular;  Laterality: Left;    OB History   No obstetric history on file.    Obstetric Comments  LMP - currently           Home Medications    Prior to Admission medications   Medication Sig Start Date End Date Taking? Authorizing Provider  cephALEXin (KEFLEX) 500 MG capsule Take 2 capsules (1,000 mg total) by mouth 2 (two) times daily. 05/20/22  Yes Rodriguez-Southworth, Sunday Spillers, PA-C  ELIQUIS 5 MG TABS tablet Take 1 tablet (5 mg total) by mouth 2 (two) times daily. 11/01/21   Kris Hartmann, NP  norethindrone (MICRONOR) 0.35 MG tablet Take 1 tablet (0.35 mg total) by mouth daily. 04/08/22   Radene Gunning, MD  trimethoprim-polymyxin b (POLYTRIM) ophthalmic solution Place 2 drops into the left eye every 6 (six) hours. 05/13/22   Lyndee Hensen, DO    Family History Family History  Problem Relation Age of Onset   Hypertension Mother    Hypertension  Father    Nephrolithiasis Father    Hypertension Brother    Multiple sclerosis Brother    Deep vein thrombosis Maternal Aunt    Liver disease Maternal Grandfather     Social History Social History   Tobacco Use   Smoking status: Some Days   Smokeless tobacco: Never   Tobacco comments:    Pt smokes Hookah  Vaping Use   Vaping Use: Former  Substance Use Topics   Alcohol use: Not Currently    Comment: rare   Drug use: Never     Allergies   Etonogestrel-ethinyl estradiol, Erythromycin, and Sulfa antibiotics   Review of Systems Review of Systems  Eyes:  Positive for discharge. Negative for photophobia, pain, redness, itching and visual disturbance.       + L eye lid pain, swelling, lump and drainage from the edge of the lid     Physical Exam Triage Vital Signs ED Triage Vitals  Enc Vitals Group     BP 05/20/22 1609 132/85     Pulse Rate 05/20/22 1609 (!) 102     Resp 05/20/22 1609 14     Temp 05/20/22 1609 98.8 F (37.1 C)     Temp Source 05/20/22 1609 Oral     SpO2 05/20/22 1609 98 %     Weight 05/20/22 1607 235 lb 14.3  oz (107 kg)     Height 05/20/22 1607 '5\' 6"'$  (1.676 m)     Head Circumference --      Peak Flow --      Pain Score 05/20/22 1607 3     Pain Loc --      Pain Edu? --      Excl. in [place]? --    No data found.  Updated Vital Signs BP 132/85 (BP Location: Left Arm)   Pulse (!) 102   Temp 98.8 F (37.1 C) (Oral)   Resp 14   Ht '5\' 6"'$  (1.676 m)   Wt 235 lb 14.3 oz (107 kg)   LMP 04/29/2022 (Approximate)   SpO2 98%   BMI 38.07 kg/m   Visual Acuity Right Eye Distance:   Left Eye Distance:   Bilateral Distance:    Right Eye Near:   Left Eye Near:    Bilateral Near:     Physical Exam Constitutional:      General: She is not in acute distress.    Appearance: She is obese. She is not toxic-appearing.  Eyes:     Conjunctiva/sclera: Conjunctivae normal.     Pupils: Pupils are equal, round, and reactive to light.     Comments: She had  placed a Band-Aid over the L upper lid which has a 1.25 cm x 1.25 cm mass with skin over it erythematous, tender and yellow crusting on the edge of it.   Pulmonary:     Effort: Pulmonary effort is normal.  Musculoskeletal:     Cervical back: Neck supple.  Skin:    General: Skin is warm and dry.     Findings: Erythema present.  Neurological:     Mental Status: She is alert.     Gait: Gait normal.  Psychiatric:        Thought Content: Thought content normal.        Judgment: Judgment normal.      UC Treatments / Results  Labs (all labs ordered are listed, but only abnormal results are displayed) Labs Reviewed - No data to display  EKG   Radiology No results found.  Procedures Procedures (including critical care time)  Medications Ordered in UC Medications - No data to display  Initial Impression / Assessment and Plan / UC Course  I have reviewed the triage vital signs and the nursing notes. She was explained that me or the other provider do not do I&D's on eye lids.  L upper lid abscess possibly from Chalazion  I searched for eye MD's to get her seen today, but most were closed. I called [facility] and left them 2 messages to see if they could work her in. The second time since they never called me back, and before they closed, I left them her information for them to call her directly. I placed her on Keflex as noted. Advised to do warm compresses and Fu with [facility] on Monday if she did not get worse, but the area was not better. If she gets worse tomorrow, she was told to go to ER.  See instructions.    Final Clinical Impressions(s) / UC Diagnoses   Final diagnoses:  Chalazion left upper eyelid     Discharge Instructions      Follow up with [facility] on Monday. They have your name and phone number to contact you directly, hopefully today.  If your eye lid gets worse tomorrow, then go to the ER.  [address] [place], [place]  [address]  [phone] I believe you have an infected abscess on your upper lid.  In the mean time apply warm compresses for 15 minutes 4 times a day and start the oral antibiotic I have sent to your pharmacy.      ED Prescriptions     Medication Sig Dispense Auth. Provider   cephALEXin (KEFLEX) 500 MG capsule Take 2 capsules (1,000 mg total) by mouth 2 (two) times daily. 40 capsule Rodriguez-Southworth, Sunday Spillers, PA-C      PDMP not reviewed this encounter.   Shelby Mattocks, [place] 05/20/22 1702

## 2022-05-20 NOTE — Discharge Instructions (Addendum)
Follow up with [facility] on Monday. They have your name and phone number to contact you directly, hopefully today.  If your eye lid gets worse tomorrow, then go to the ER.  [address] [place], [place] [address]  [phone] I believe you have an infected abscess on your upper lid.  In the mean time apply warm compresses for 15 minutes 4 times a day and start the oral antibiotic I have sent to your pharmacy.

## 2022-05-24 ENCOUNTER — Ambulatory Visit: Payer: Federal, State, Local not specified - PPO | Admitting: Oncology

## 2022-05-24 ENCOUNTER — Ambulatory Visit: Payer: Federal, State, Local not specified - PPO

## 2022-05-26 DIAGNOSIS — H0014 Chalazion left upper eyelid: Secondary | ICD-10-CM | POA: Diagnosis not present

## 2022-05-26 DIAGNOSIS — H0289 Other specified disorders of eyelid: Secondary | ICD-10-CM | POA: Diagnosis not present

## 2022-06-06 ENCOUNTER — Encounter (INDEPENDENT_AMBULATORY_CARE_PROVIDER_SITE_OTHER): Payer: Self-pay | Admitting: Nurse Practitioner

## 2022-06-06 ENCOUNTER — Ambulatory Visit (INDEPENDENT_AMBULATORY_CARE_PROVIDER_SITE_OTHER): Payer: Federal, State, Local not specified - PPO

## 2022-06-06 ENCOUNTER — Ambulatory Visit (INDEPENDENT_AMBULATORY_CARE_PROVIDER_SITE_OTHER): Payer: Federal, State, Local not specified - PPO | Admitting: Nurse Practitioner

## 2022-06-06 VITALS — BP 132/89 | HR 73 | Resp 18 | Ht 66.0 in | Wt 240.4 lb

## 2022-06-06 DIAGNOSIS — I82412 Acute embolism and thrombosis of left femoral vein: Secondary | ICD-10-CM

## 2022-06-06 DIAGNOSIS — D259 Leiomyoma of uterus, unspecified: Secondary | ICD-10-CM

## 2022-06-06 MED ORDER — APIXABAN 2.5 MG PO TABS: 2.5000 mg | ORAL_TABLET | Freq: Two times a day (BID) | ORAL | 6 refills | Status: DC

## 2022-06-06 NOTE — Progress Notes (Signed)
Subjective:    Patient ID: Debra Jacobs, female    DOB: 07/19/1980, 42 y.o.   MRN: [id number] Chief Complaint  Patient presents with   Follow-up    Ultrasound consult f/u in 6 months with right venous    Debra Jacobs is a 42 year old female who returns today for follow-up evaluation of her left lower extremity DVT.  She underwent intervention in June 2023.  Since that time she has been on Eliquis 5 mg twice daily.  She has been very diligent with this.  She notes that approximately a couple months after beginning this she began to have significantly heavy menstrual cycles.  She does have known fibroids.  She has some swelling in the left ankle occasionally this is largely well-controlled with her use of medical grade compression, she denies any open wounds or ulcerations.    Review of Systems  Cardiovascular:  Positive for leg swelling.  Hematological:  Bruises/bleeds easily.  All other systems reviewed and are negative.      Objective:   Physical Exam Vitals reviewed.  HENT:     Head: Normocephalic.  Cardiovascular:     Rate and Rhythm: Normal rate.  Pulmonary:     Effort: Pulmonary effort is normal.  Musculoskeletal:     Left lower leg: Edema present.  Skin:    General: Skin is warm and dry.  Neurological:     Mental Status: She is alert and oriented to person, place, and time.  Psychiatric:        Mood and Affect: Mood normal.        Behavior: Behavior normal.        Thought Content: Thought content normal.        Judgment: Judgment normal.     BP 132/89 (BP Location: Left Arm)   Pulse 73   Resp 18   Ht 5' 6"$  (1.676 m)   Wt 240 lb 6.4 oz (109 kg)   LMP 04/29/2022 (Approximate)   BMI 38.80 kg/m   Past Medical History:  Diagnosis Date   DVT (deep venous thrombosis) ([facility])    Dysmenorrhea     Social History   Socioeconomic History   Marital status: Single    Spouse name: Not on file   Number of children: Not on file   Years of education: Not on  file   Highest education level: Not on file  Occupational History   Not on file  Tobacco Use   Smoking status: Some Days   Smokeless tobacco: Never   Tobacco comments:    Pt smokes Hookah  Vaping Use   Vaping Use: Former  Substance and Sexual Activity   Alcohol use: Not Currently    Comment: rare   Drug use: Never   Sexual activity: Not on file  Other Topics Concern   Not on file  Social History Narrative   Not on file   Social Determinants of Health   Financial Resource Strain: Not on file  Food Insecurity: Not on file  Transportation Needs: Not on file  Physical Activity: Not on file  Stress: Not on file  Social Connections: Not on file  Intimate Partner Violence: Not on file    Past Surgical History:  Procedure Laterality Date   PERIPHERAL VASCULAR THROMBECTOMY Left 10/07/2021   Procedure: PERIPHERAL VASCULAR THROMBECTOMY;  Surgeon: Algernon Huxley, MD;  Location: [facility];  Service: Cardiovascular;  Laterality: Left;    Family History  Problem Relation Age of Onset  Hypertension Mother    Hypertension Father    Nephrolithiasis Father    Hypertension Brother    Multiple sclerosis Brother    Deep vein thrombosis Maternal Aunt    Liver disease Maternal Grandfather     Allergies  Allergen Reactions   Etonogestrel-Ethinyl Estradiol    Erythromycin Rash and Swelling    AND SWELLING     Sulfa Antibiotics Swelling       Latest Ref Rng & Units 05/13/2022    1:29 PM 02/11/2022   12:57 PM 12/23/2021   11:44 AM  CBC  WBC 4.0 - 10.5 K/uL 8.4  6.8  7.7   Hemoglobin 12.0 - 15.0 g/dL 8.2  9.2  9.5   Hematocrit 36.0 - 46.0 % 27.8  30.4  31.3   Platelets 150 - 400 K/uL 515  373  512       CMP     Component Value Date/Time   NA 134 (L) 11/10/2021 1543   K 3.1 (L) 11/10/2021 1543   CL 104 11/10/2021 1543   CO2 26 11/10/2021 1543   GLUCOSE 104 (H) 11/10/2021 1543   BUN 11 11/10/2021 1543   CREATININE 0.64 11/10/2021 1543   CALCIUM 8.5 (L)  11/10/2021 1543   PROT 7.3 11/10/2021 1543   ALBUMIN 3.3 (L) 11/10/2021 1543   AST 22 11/10/2021 1543   ALT 16 11/10/2021 1543   ALKPHOS 66 11/10/2021 1543   BILITOT 0.3 11/10/2021 1543   GFRNONAA >60 11/10/2021 1543     No results found.     Assessment & Plan:   1. Acute deep vein thrombosis (DVT) of femoral vein of left lower extremity (HCC) Today the patient's DVT is improved from previous studies.  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  No evidence of worsening DVT.  Will decrease the patient's Eliquis from 5 mg twice daily to 2.5 mg twice daily.   2. Uterine leiomyoma, unspecified location I had a long discussion with the patient in regards to her Eliquis and her menstrual cycles and her fibroids.  The patient's drastically worsen menstrual cycles occurred shortly after beginning her Eliquis.  It stands to reason that this may be the cause of her worsening cycles.  However, given the fact that the patient has fibroids this cannot be completely ruled out as a cause either.  Following extensive discussion with the patient we will decrease her Eliquis dose, as she has been on full anticoagulation for well over 6 months.  We discussed possible treatment options for fibroids.  In the case of hysterectomy the patient will likely need to hold her Eliquis for a short timeframe.  We discussed uterine fibroid embolization, which would allow her to continue with anticoagulation however it would cause the patient to be unable to conceive.  I discussed with the patient that typically within the first 3 years the ability to conceive is near impossible.  Going forward, the uterine fibroid embolization acts as a form of birth control and so while it is not impossible is highly unlikely that she would become pregnant postembolization.  Given these options, we elected to decrease the patient's Eliquis dose as mentioned above.  We are hopeful that by decreasing the  patient's Eliquis dose we may note that the bleeding is decreased and she may not necessarily need any further intervention for treatment of her fibroids.  Will plan on having her return in 3 months to evaluate her progress with her menstrual cycles.  She is advised to follow-up with [place] sooner as necessary.   Current Outpatient Medications on File Prior to Visit  Medication Sig Dispense Refill   trimethoprim-polymyxin b (POLYTRIM) ophthalmic solution Place 2 drops into the left eye every 6 (six) hours. 10 mL 0   norethindrone (MICRONOR) 0.35 MG tablet Take 1 tablet (0.35 mg total) by mouth daily. (Patient not taking: Reported on 06/06/2022) 84 tablet 3   No current facility-administered medications on file prior to visit.    There are no Patient Instructions on file for this visit. No follow-ups on file.   Kris Hartmann, NP

## 2022-06-07 ENCOUNTER — Inpatient Hospital Stay: Payer: Federal, State, Local not specified - PPO | Attending: Oncology

## 2022-06-07 DIAGNOSIS — Z79899 Other long term (current) drug therapy: Secondary | ICD-10-CM | POA: Diagnosis not present

## 2022-06-07 DIAGNOSIS — Z86718 Personal history of other venous thrombosis and embolism: Secondary | ICD-10-CM | POA: Insufficient documentation

## 2022-06-07 DIAGNOSIS — F17200 Nicotine dependence, unspecified, uncomplicated: Secondary | ICD-10-CM | POA: Insufficient documentation

## 2022-06-07 DIAGNOSIS — D500 Iron deficiency anemia secondary to blood loss (chronic): Secondary | ICD-10-CM | POA: Diagnosis not present

## 2022-06-07 DIAGNOSIS — N920 Excessive and frequent menstruation with regular cycle: Secondary | ICD-10-CM | POA: Diagnosis not present

## 2022-06-07 DIAGNOSIS — D259 Leiomyoma of uterus, unspecified: Secondary | ICD-10-CM | POA: Diagnosis not present

## 2022-06-07 DIAGNOSIS — Z7901 Long term (current) use of anticoagulants: Secondary | ICD-10-CM | POA: Insufficient documentation

## 2022-06-07 DIAGNOSIS — D6859 Other primary thrombophilia: Secondary | ICD-10-CM | POA: Diagnosis not present

## 2022-06-07 LAB — CBC WITH DIFFERENTIAL/PLATELET
Abs Immature Granulocytes: 0.01 10*3/uL (ref 0.00–0.07)
Basophils Absolute: 0 10*3/uL (ref 0.0–0.1)
Basophils Relative: 1 %
Eosinophils Absolute: 0.1 10*3/uL (ref 0.0–0.5)
Eosinophils Relative: 1 %
HCT: 26.7 % — ABNORMAL LOW (ref 36.0–46.0)
Hemoglobin: 7.6 g/dL — ABNORMAL LOW (ref 12.0–15.0)
Immature Granulocytes: 0 %
Lymphocytes Relative: 25 %
Lymphs Abs: 1.3 10*3/uL (ref 0.7–4.0)
MCH: 21.7 pg — ABNORMAL LOW (ref 26.0–34.0)
MCHC: 28.5 g/dL — ABNORMAL LOW (ref 30.0–36.0)
MCV: 76.3 fL — ABNORMAL LOW (ref 80.0–100.0)
Monocytes Absolute: 0.8 10*3/uL (ref 0.1–1.0)
Monocytes Relative: 15 %
Neutro Abs: 3 10*3/uL (ref 1.7–7.7)
Neutrophils Relative %: 58 %
Platelets: 475 10*3/uL — ABNORMAL HIGH (ref 150–400)
RBC: 3.5 MIL/uL — ABNORMAL LOW (ref 3.87–5.11)
RDW: 19.2 % — ABNORMAL HIGH (ref 11.5–15.5)
WBC: 5.2 10*3/uL (ref 4.0–10.5)
nRBC: 0 % (ref 0.0–0.2)

## 2022-06-07 LAB — IRON AND TIBC
Iron: 18 ug/dL — ABNORMAL LOW (ref 28–170)
Saturation Ratios: 4 % — ABNORMAL LOW (ref 10.4–31.8)
TIBC: 489 ug/dL — ABNORMAL HIGH (ref 250–450)
UIBC: 471 ug/dL

## 2022-06-07 LAB — FERRITIN: Ferritin: 3 ng/mL — ABNORMAL LOW (ref 11–307)

## 2022-06-08 LAB — PROTEIN C ACTIVITY: Protein C Activity: 70 % — ABNORMAL LOW (ref 73–180)

## 2022-06-09 ENCOUNTER — Inpatient Hospital Stay (HOSPITAL_BASED_OUTPATIENT_CLINIC_OR_DEPARTMENT_OTHER): Payer: Federal, State, Local not specified - PPO | Admitting: Oncology

## 2022-06-09 ENCOUNTER — Inpatient Hospital Stay: Payer: Federal, State, Local not specified - PPO

## 2022-06-09 ENCOUNTER — Encounter: Payer: Self-pay | Admitting: Oncology

## 2022-06-09 VITALS — BP 140/93 | HR 86 | Temp 97.3°F | Resp 18 | Wt 241.9 lb

## 2022-06-09 VITALS — BP 129/90 | HR 92

## 2022-06-09 DIAGNOSIS — D500 Iron deficiency anemia secondary to blood loss (chronic): Secondary | ICD-10-CM | POA: Diagnosis not present

## 2022-06-09 DIAGNOSIS — D6859 Other primary thrombophilia: Secondary | ICD-10-CM | POA: Diagnosis not present

## 2022-06-09 DIAGNOSIS — Z7901 Long term (current) use of anticoagulants: Secondary | ICD-10-CM | POA: Diagnosis not present

## 2022-06-09 DIAGNOSIS — D259 Leiomyoma of uterus, unspecified: Secondary | ICD-10-CM

## 2022-06-09 DIAGNOSIS — Z79899 Other long term (current) drug therapy: Secondary | ICD-10-CM | POA: Diagnosis not present

## 2022-06-09 DIAGNOSIS — I82412 Acute embolism and thrombosis of left femoral vein: Secondary | ICD-10-CM | POA: Diagnosis not present

## 2022-06-09 DIAGNOSIS — N920 Excessive and frequent menstruation with regular cycle: Secondary | ICD-10-CM | POA: Diagnosis not present

## 2022-06-09 DIAGNOSIS — Z86718 Personal history of other venous thrombosis and embolism: Secondary | ICD-10-CM | POA: Diagnosis not present

## 2022-06-09 DIAGNOSIS — F17200 Nicotine dependence, unspecified, uncomplicated: Secondary | ICD-10-CM | POA: Diagnosis not present

## 2022-06-09 MED ORDER — SODIUM CHLORIDE 0.9 % IV SOLN
Freq: Once | INTRAVENOUS | Status: DC
  Filled 2022-06-09: qty 250

## 2022-06-09 MED ORDER — SODIUM CHLORIDE 0.9% FLUSH
10.0000 mL | Freq: Once | INTRAVENOUS | Status: AC | PRN
  Administered 2022-06-09: 10 mL
  Filled 2022-06-09: qty 10

## 2022-06-09 MED ORDER — SODIUM CHLORIDE 0.9 % IV SOLN
Freq: Once | INTRAVENOUS | Status: AC
  Filled 2022-06-09: qty 250

## 2022-06-09 MED ORDER — SODIUM CHLORIDE 0.9 % IV SOLN
200.0000 mg | Freq: Once | INTRAVENOUS | Status: AC
  Administered 2022-06-09: 200 mg via INTRAVENOUS
  Filled 2022-06-09: qty 200

## 2022-06-09 NOTE — Assessment & Plan Note (Addendum)
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 I called BEulogio Ditchwho manages her anticoagulation and discussed about my recommendation

## 2022-06-09 NOTE — Assessment & Plan Note (Addendum)
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 There is lack of data of the effectiveness of Eliquis 2.549mBID.

## 2022-06-09 NOTE — Progress Notes (Signed)
Hematology/Oncology Progress note [phone] [phone]        CHIEF COMPLAINTS/PURPOSE OF CONSULTATION:  Acute lower extremity DVT, anemia  ASSESSMENT & PLAN:   Iron deficiency anemia due to chronic blood loss Severe Iron deficiency anemia,  Labs are reviewed and discussed with patient. Hemoglobin 7.6 ferritin 3, iron saturation 4 Recommend IV venofer weekly x5.  Repeat iron labs after finish 5th treatment for assessment need of additional Venofer.   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 There is lack of data of the effectiveness of Eliquis 2.70m370mID.   Fibroid uterus Continue follow up with Gyn for further management.  Heavy menstrual bleeding, Gyn has recommend Progesterone Only Birth Control Pills (POP) as a bridge to other options-RFA (I.e. Sonata), [place] and hysterectomy as well as endometrial ablation. POP confers minimal or no increased risk of VTE in studies and I think it is a reasonable option.  I have sent a message to Dr.Duncan, awaiting call back to further discuss her case.    Orders Placed This Encounter  Procedures   CBC with Differential ([facility])    Standing Status:   Future    Standing Expiration Date:   06/10/2023   Iron and TIBC    Standing Status:   Future    Standing Expiration Date:   06/10/2023   Ferritin    Standing Status:   Future    Standing Expiration Date:   06/10/2023   CBC with Differential ([facility])    Standing Status:    Future    Standing Expiration Date:   06/10/2023   Iron and TIBC    Standing Status:   Future    Standing Expiration Date:   06/10/2023   Ferritin    Standing Status:   Future    Standing Expiration Date:   06/10/2023   Follow up  IV venofer weekly x 5 Week of 4/1 lab - cbc iron tibc ferritin   Follow up in 3 months, labs prior to MD +/- Venofer    All questions were answered. The patient knows to call the clinic with any problems, questions or concerns. No barriers to learning was detected.  ZhoEarlie ServerD 06/09/2022     HISTORY OF PRESENTING ILLNESS:  Debra Jacobs 41 27o. female present to establish care for acute lower extremity DVT, anemia.  Patient has fibroid uterus and was recommended by gynecology to start OCP in February/March 2023. 09/07/2021, patient presented emergency room for evaluation of left calf pain.  She was diagnosed with tendinitis.  Patient was referred to see orthopedic surgeon.  Patient was recommended to wear boot 09/29/2021, patient was seen by orthopedic surgeon again.  Patient has had persistent pain and swelling to the left lower extremity.  09/29/2021 ultrasound left lower extremity was obtained and showed extensive thrombus present from the left common femoral vein through the imaged portion of the calf veins.  Thrombus is occlusive along the length of the femoral vein, popliteal vein, and a profunda femoris..  Central extent of thrombus is not clearly established  by ultrasound and possibly extends to the pelvis. Patient was started on Eliquis for anticoagulation. 10/05/2021, patient was seen by vascular surgeon Dr. Lucky Cowboy .  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  10/07/21 patient underwent catheter directed thrombolysis and mechanical thrombectomy.  IVC was placed.  Patient tolerates anticoagulation with Eliquis.  Lower extremity swelling improved after procedure.  She wears  compression stocking. + Heavy menstrual period due to fibroid uterus.  She is anemic. 05/23/2021, CBC showed hemoglobin of 9.5.  MCV 74. +Fatigue, tired.  Shortness of breath with exertion.  02/12/22 Venous US.  RIGHT: - No evidence of common femoral vein obstruction.  LEFT: - Findings appear improved from previous examination. - The SFV proximal to mid, and the popliteal vein only show near complete compression with some residual chronic appearing thrombus seen.  INTERVAL HISTORY Debra Jacobs is a 42 y.o. female who has above history reviewed by me today presents for follow up visit for anemia and history DVT.  She tolerates IV venofer treatments. Fatigue is worse lately.  Menstrual period is very heavy. She follows up with Gyn and was recommended POP bridging to other options. She was recently seen by Vein and Vascular NP Debra Jacobs. Due to the heavy menstrual period, her Eliquis dose has been decreased to 2.21m BID.     MEDICAL HISTORY:  Past Medical History:  Diagnosis Date   DVT [place]deep venous thrombosis) ([facility]    Dysmenorrhea     SURGICAL HISTORY: Past Surgical History:  Procedure Laterality Date   PERIPHERAL VASCULAR THROMBECTOMY Left 10/07/2021   Procedure: PERIPHERAL VASCULAR THROMBECTOMY;  Surgeon: DAlgernon Huxley MD;  [facility];  Service: Cardiovascular;  Laterality: Left;    SOCIAL HISTORY: Social History   Socioeconomic History   Marital status: Single    Spouse name: Not on file   Number of children: Not on file   Years of education: Not on file   Highest education level: Not on file  Occupational History   Not on file  Tobacco Use   Smoking status: Some Days   Smokeless tobacco: Never   Tobacco comments:    Pt smokes Hookah  Vaping Use   Vaping Use: Former  Substance and Sexual Activity   Alcohol use: Not Currently    Comment: rare   Drug use: Never   Sexual activity: Not on file  Other Topics Concern   Not on file  Social  History Narrative   Not on file   Social Determinants of Health   Financial Resource Strain: Not on file  Food Insecurity: Not on file  Transportation Needs: Not on file  Physical Activity: Not on file  Stress: Not on file  Social Connections: Not on file  Intimate Partner Violence: Not on file    FAMILY HISTORY: Family History  Problem Relation Age of Onset   Hypertension Mother    Hypertension Father    Nephrolithiasis Father    Hypertension Brother    Multiple sclerosis Brother    Deep vein thrombosis Maternal Aunt    Liver disease Maternal Grandfather     ALLERGIES:  is allergic to etonogestrel-ethinyl estradiol, erythromycin, and sulfa antibiotics.  MEDICATIONS:  Current Outpatient Medications  Medication Sig Dispense Refill   apixaban (ELIQUIS) 2.5 MG TABS tablet Take 1 tablet (2.5 mg total) by mouth 2 (two) times daily. 60 tablet 6   trimethoprim-polymyxin b (POLYTRIM) ophthalmic solution Place 2 drops into  the left eye every 6 (six) hours. 10 mL 0   norethindrone (MICRONOR) 0.35 MG tablet Take 1 tablet (0.35 mg total) by mouth daily. (Patient not taking: Reported on 06/06/2022) 84 tablet 3   No current facility-administered medications for this visit.    Review of Systems  Constitutional:  Positive for fatigue. Negative for appetite change, chills and fever.  HENT:   Negative for hearing loss and voice change.   Eyes:  Negative for eye problems.  Respiratory:  Negative for chest tightness and cough.   Cardiovascular:  Negative for chest pain.  Gastrointestinal:  Negative for abdominal distention, abdominal pain and blood in stool.  Endocrine: Negative for hot flashes.  Genitourinary:  Positive for menstrual problem. Negative for difficulty urinating and frequency.   Musculoskeletal:  Negative for arthralgias.  Skin:  Negative for itching and rash.  Neurological:  Negative for extremity weakness.  Hematological:  Negative for adenopathy.   Psychiatric/Behavioral:  Negative for confusion.      PHYSICAL EXAMINATION: ECOG PERFORMANCE STATUS: 1 - Symptomatic but completely ambulatory  Vitals:   06/09/22 1331 06/09/22 1408  BP: (!) 154/106 (!) 140/93  Pulse: 84 86  Resp: 18   Temp: (!) 97.3 F (36.3 C)   SpO2: 100%    Filed Weights   06/09/22 1331  Weight: 241 lb 14.4 oz (109.7 kg)    Physical Exam Constitutional:      General: She is not in acute distress.    Appearance: She is obese.  HENT:     Head: Normocephalic and atraumatic.     Mouth/Throat:     Pharynx: No oropharyngeal exudate.  Eyes:     General: No scleral icterus. Cardiovascular:     Rate and Rhythm: Normal rate.     Heart sounds: No murmur heard. Pulmonary:     Effort: Pulmonary effort is normal. No respiratory distress.     Breath sounds: No wheezing.  Abdominal:     General: There is no distension.     Palpations: Abdomen is soft.     Tenderness: There is no abdominal tenderness.  Musculoskeletal:        General: Normal range of motion.     Cervical back: Normal range of motion.  Skin:    General: Skin is warm.     Findings: No erythema.  Neurological:     Mental Status: She is alert and oriented to person, place, and time.     Cranial Nerves: No cranial nerve deficit.     Motor: No abnormal muscle tone.  Psychiatric:        Mood and Affect: Mood and affect normal.      LABORATORY DATA:  I have reviewed the data as listed     Latest Ref Rng & Units 06/07/2022   11:00 AM 05/13/2022    1:29 PM 02/11/2022   12:57 PM  CBC  WBC 4.0 - 10.5 K/uL 5.2  8.4  6.8   Hemoglobin 12.0 - 15.0 g/dL 7.6  8.2  9.2   Hematocrit 36.0 - 46.0 % 26.7  27.8  30.4   Platelets 150 - 400 K/uL 475  515  373       Latest Ref Rng & Units 11/10/2021    3:43 PM 10/07/2021    1:25 PM  CMP  Glucose 70 - 99 mg/dL 104    BUN 6 - 20 mg/dL 11  8   Creatinine 0.44 - 1.00 mg/dL 0.64  0.72   Sodium 135 - 145  mmol/L 134    Potassium 3.5 - 5.1 mmol/L 3.1     Chloride 98 - 111 mmol/L 104    CO2 22 - 32 mmol/L 26    Calcium 8.9 - 10.3 mg/dL 8.5    Total Protein 6.5 - 8.1 g/dL 7.3    Total Bilirubin 0.3 - 1.2 mg/dL 0.3    Alkaline Phos 38 - 126 U/L 66    AST 15 - 41 U/L 22    ALT 0 - 44 U/L 16     Lab Results  Component Value Date   IRON 18 (L) 06/07/2022   TIBC 489 (H) 06/07/2022   FERRITIN 3 (L) 06/07/2022      RADIOGRAPHIC STUDIES: I have personally reviewed the radiological images as listed and agreed with the findings in the report. VAS [place] LOWER EXTREMITY VENOUS (DVT)  Result Date: 06/08/2022  Lower Venous DVT Study Patient Name:  NADIJA DHALIWAL  Date of Exam:   06/06/2022 Medical Rec #: [id number]         Accession #:    [id number] Date of Birth: [birth year]          Patient Gender: F Patient Age:   62 years Exam Location:  [facility] Procedure:      VAS [place] LOWER EXTREMITY VENOUS (DVT) Referring Phys: Arna Medici BROWN --------------------------------------------------------------------------------  Risk Factors: DVT Hx of DVT left leg. Comparison Study: 02/02/2022 Performing Technologist: Almira Coaster RVS  Examination Guidelines: A complete evaluation includes B-mode imaging, spectral Doppler, color Doppler, and power Doppler as needed of all accessible portions of each vessel. Bilateral testing is considered an integral part of a complete examination. Limited examinations for reoccurring indications may be performed as noted. The reflux portion of the exam is performed with the patient in reverse Trendelenburg.  +-----+---------------+---------+-----------+----------+--------------+ RIGHTCompressibilityPhasicitySpontaneityPropertiesThrombus Aging +-----+---------------+---------+-----------+----------+--------------+ CFV  Full           Yes      Yes                                 +-----+---------------+---------+-----------+----------+--------------+    +---------+---------------+---------+-----------+----------+--------------+ LEFT     CompressibilityPhasicitySpontaneityPropertiesThrombus Aging +---------+---------------+---------+-----------+----------+--------------+ CFV      Full           Yes      Yes                                 +---------+---------------+---------+-----------+----------+--------------+ SFJ      Full           Yes      Yes                                 +---------+---------------+---------+-----------+----------+--------------+ FV Prox  Full           Yes      Yes                                 +---------+---------------+---------+-----------+----------+--------------+ FV Mid   Full           Yes      Yes                                 +---------+---------------+---------+-----------+----------+--------------+  FV DistalFull           Yes      Yes                                 +---------+---------------+---------+-----------+----------+--------------+ PFV      Full           Yes      Yes                                 +---------+---------------+---------+-----------+----------+--------------+ POP      Full           Yes      Yes                                 +---------+---------------+---------+-----------+----------+--------------+ PTV      Full           Yes      Yes                                 +---------+---------------+---------+-----------+----------+--------------+ PERO     Full           Yes      Yes                                 +---------+---------------+---------+-----------+----------+--------------+ GSV      Full           Yes      Yes                                 +---------+---------------+---------+-----------+----------+--------------+ SSV      Full           Yes      Yes                                 +---------+---------------+---------+-----------+----------+--------------+    Summary: RIGHT: - No evidence of common  femoral vein obstruction.  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 - Findings appear improved from previous examination. - There is no evidence of superficial venous thrombosis. - There is no evidence of deep vein thrombosis in the lower extremity. - There is no evidence of chronic venous insufficiency.  *See table(s) above for measurements and observations. Electronically signed by Leotis Pain MD on 06/08/2022 at 4:08:13 PM.    Final

## 2022-06-09 NOTE — Assessment & Plan Note (Signed)
Continue follow up with Gyn for further management.  Heavy menstrual bleeding, Gyn has recommend Progesterone Only Birth Control Pills (POP) as a bridge to other options-RFA (I.e. Sonata), [place], myomectomy and hysterectomy as well as endometrial ablation. POP confers minimal or no increased risk of VTE in studies and I think it is a reasonable option.  I have sent a message to Dr.Duncan, awaiting call back to further discuss her case.

## 2022-06-09 NOTE — Progress Notes (Signed)
Patient tolerated Venofer infusion well. Explained recommendation of 30 min post monitoring. Patient refused to wait post monitoring. Educated on what signs to watch for & to call with any concerns. No questions, discharged. Stable

## 2022-06-09 NOTE — Assessment & Plan Note (Signed)
Severe Iron deficiency anemia,  Labs are reviewed and discussed with patient. Hemoglobin 7.6 ferritin 3, iron saturation 4 Recommend IV venofer weekly x5.  Repeat iron labs after finish 5th treatment for assessment need of additional Venofer.

## 2022-06-09 NOTE — Patient Instructions (Signed)

## 2022-06-10 ENCOUNTER — Encounter: Payer: Self-pay | Admitting: Oncology

## 2022-06-10 LAB — PROTEIN C, TOTAL: Protein C, Total: 64 % (ref 60–150)

## 2022-06-14 ENCOUNTER — Telehealth: Payer: Self-pay | Admitting: *Deleted

## 2022-06-14 NOTE — Telephone Encounter (Signed)
Left message with pt to follow up from Dr Carole Civil message to see if I could ger her set up with one of our gyn providers.

## 2022-06-15 MED FILL — Iron Sucrose Inj 20 MG/ML (Fe Equiv): INTRAVENOUS | Qty: 10 | Status: AC

## 2022-06-16 ENCOUNTER — Inpatient Hospital Stay: Payer: Federal, State, Local not specified - PPO

## 2022-06-23 ENCOUNTER — Inpatient Hospital Stay: Payer: Federal, State, Local not specified - PPO

## 2022-06-30 ENCOUNTER — Inpatient Hospital Stay: Payer: Federal, State, Local not specified - PPO

## 2022-07-07 ENCOUNTER — Inpatient Hospital Stay: Payer: Federal, State, Local not specified - PPO

## 2022-07-18 ENCOUNTER — Inpatient Hospital Stay: Payer: Federal, State, Local not specified - PPO

## 2022-08-18 ENCOUNTER — Other Ambulatory Visit (INDEPENDENT_AMBULATORY_CARE_PROVIDER_SITE_OTHER): Payer: Self-pay

## 2022-08-18 ENCOUNTER — Encounter (INDEPENDENT_AMBULATORY_CARE_PROVIDER_SITE_OTHER): Payer: Self-pay

## 2022-08-18 MED ORDER — APIXABAN 2.5 MG PO TABS: 2.5000 mg | ORAL_TABLET | Freq: Two times a day (BID) | ORAL | 6 refills | Status: AC

## 2022-09-07 ENCOUNTER — Other Ambulatory Visit: Payer: Federal, State, Local not specified - PPO

## 2022-09-09 ENCOUNTER — Ambulatory Visit (INDEPENDENT_AMBULATORY_CARE_PROVIDER_SITE_OTHER): Payer: Federal, State, Local not specified - PPO | Admitting: Vascular Surgery

## 2022-09-14 ENCOUNTER — Ambulatory Visit: Payer: Federal, State, Local not specified - PPO | Admitting: Oncology

## 2022-09-14 ENCOUNTER — Ambulatory Visit: Payer: Federal, State, Local not specified - PPO

## 2022-10-04 ENCOUNTER — Encounter (INDEPENDENT_AMBULATORY_CARE_PROVIDER_SITE_OTHER): Payer: Self-pay | Admitting: Vascular Surgery

## 2022-10-04 ENCOUNTER — Ambulatory Visit (INDEPENDENT_AMBULATORY_CARE_PROVIDER_SITE_OTHER): Payer: Federal, State, Local not specified - PPO | Admitting: Vascular Surgery

## 2022-10-04 ENCOUNTER — Other Ambulatory Visit (INDEPENDENT_AMBULATORY_CARE_PROVIDER_SITE_OTHER): Payer: Federal, State, Local not specified - PPO

## 2022-10-04 ENCOUNTER — Other Ambulatory Visit (INDEPENDENT_AMBULATORY_CARE_PROVIDER_SITE_OTHER): Payer: Self-pay | Admitting: Vascular Surgery

## 2022-10-04 VITALS — BP 142/91 | HR 90 | Resp 18 | Ht 66.0 in | Wt 243.6 lb

## 2022-10-04 DIAGNOSIS — I890 Lymphedema, not elsewhere classified: Secondary | ICD-10-CM

## 2022-10-04 DIAGNOSIS — M7989 Other specified soft tissue disorders: Secondary | ICD-10-CM | POA: Diagnosis not present

## 2022-10-04 DIAGNOSIS — I87009 Postthrombotic syndrome without complications of unspecified extremity: Secondary | ICD-10-CM | POA: Diagnosis not present

## 2022-10-04 DIAGNOSIS — I82512 Chronic embolism and thrombosis of left femoral vein: Secondary | ICD-10-CM

## 2022-10-04 NOTE — Assessment & Plan Note (Signed)
Her pain and swelling of the left leg is very concerning to her, so we did a duplex today showing no acute DVT in the left lower extremity with stable chronic findings similar to her previous study. Her current symptoms are likely postphlebitic in nature.  I would recommend she continue to wear compression socks and take the 2.5 mg twice daily of Eliquis.  Elevation, exercise, and compression remain the hallmarks of treatment for postherpetic symptoms.  Given her young age and her unprovoked DVT, continuing on 2.5 mg twice daily of Eliquis would be recommended.  I will plan to see her back in 6 months.

## 2022-10-04 NOTE — Progress Notes (Addendum)
MRN : [id number]  Debra Jacobs is a 42 y.o. ([birth year]) female who presents with chief complaint of  Chief Complaint  Patient presents with   Follow-up    fu in 3 months with no studies  .  History of Present Illness: Patient returns today in follow up of her previous DVT.  She has continued to have left lower extremity swelling with some cramping and charley horses in the left calf as well.  She has noticed a little more swelling after this weekend when she was outside with a barbecue standing and in the heat.  She does wear her compression socks every day and is wearing these today.  She has moved to near [place] for work but comes back for her appointments.  She continues on Eliquis 2.5 mg twice daily.  She underwent venous thrombectomy of the left lower extremity last year.  Her pain and swelling of the left leg is very concerning to her, so we did a duplex today showing no acute DVT in the left lower extremity with stable chronic findings similar to her previous study.  Current Outpatient Medications  Medication Sig Dispense Refill   apixaban (ELIQUIS) 2.5 MG TABS tablet Take 1 tablet (2.5 mg total) by mouth 2 (two) times daily. 60 tablet 6   trimethoprim-polymyxin b (POLYTRIM) ophthalmic solution Place 2 drops into the left eye every 6 (six) hours. (Patient taking differently: Place 2 drops into the left eye as needed.) 10 mL 0   No current facility-administered medications for this visit.    Past Medical History:  Diagnosis Date   DVT (deep venous thrombosis) (HCC)    Dysmenorrhea     Past Surgical History:  Procedure Laterality Date   PERIPHERAL VASCULAR THROMBECTOMY Left 10/07/2021   Procedure: PERIPHERAL VASCULAR THROMBECTOMY;  Surgeon: Annice Needy, MD;  Location: [facility];  Service: Cardiovascular;  Laterality: Left;     Social History   Tobacco Use   Smoking status: Some Days   Smokeless tobacco: Never   Tobacco comments:    Pt smokes Hookah   Vaping Use   Vaping status: Former  Substance Use Topics   Alcohol use: Not Currently    Comment: rare   Drug use: Never       Family History  Problem Relation Age of Onset   Hypertension Mother    Hypertension Father    Nephrolithiasis Father    Hypertension Brother    Multiple sclerosis Brother    Deep vein thrombosis Maternal Aunt    Liver disease Maternal Grandfather      Allergies  Allergen Reactions   Etonogestrel-Ethinyl Estradiol Other (See Comments)    Bood clots    Erythromycin Rash and Swelling    AND SWELLING     Sulfa Antibiotics Swelling     REVIEW OF SYSTEMS (Negative unless checked)  Constitutional: [] Weight loss  [] Fever  [] Chills Cardiac: [] Chest pain   [] Chest pressure   [] Palpitations   [] Shortness of breath when laying flat   [] Shortness of breath at rest   [] Shortness of breath with exertion. Vascular:  [x] Pain in legs with walking   [x] Pain in legs at rest   [] Pain in legs when laying flat   [] Claudication   [] Pain in feet when walking  [] Pain in feet at rest  [] Pain in feet when laying flat   [x] History of DVT   [x] Phlebitis   [x] Swelling in legs   [] Varicose veins   [] Non-healing ulcers Pulmonary:   []   Uses home oxygen   [] Productive cough   [] Hemoptysis   [] Wheeze  [] COPD   [] Asthma Neurologic:  [] Dizziness  [] Blackouts   [] Seizures   [] History of stroke   [] History of TIA  [] Aphasia   [] Temporary blindness   [] Dysphagia   [] Weakness or numbness in arms   [] Weakness or numbness in legs Musculoskeletal:  [] Arthritis   [] Joint swelling   [] Joint pain   [] Low back pain Hematologic:  [] Easy bruising  [] Easy bleeding   [] Hypercoagulable state   [] Anemic   Gastrointestinal:  [] Blood in stool   [] Vomiting blood  [] Gastroesophageal reflux/heartburn   [] Abdominal pain Genitourinary:  [] Chronic kidney disease   [] Difficult urination  [] Frequent urination  [] Burning with urination   [] Hematuria Skin:  [] Rashes   [] Ulcers   [] Wounds Psychological:   [] History of anxiety   []  History of major depression.  Physical Examination  BP (!) 142/91 (BP Location: Left Arm)   Pulse 90   Resp 18   Ht 5\' 6"  (1.676 m)   Wt 243 lb 9.6 oz (110.5 kg)   BMI 39.32 kg/m  Gen:  WD/WN, NAD Head: [place]/AT, No temporalis wasting. Ear/Nose/Throat: Hearing grossly intact, nares w/o erythema or drainage Eyes: Conjunctiva clear. Sclera non-icteric Neck: Supple.  Trachea midline Pulmonary:  Good air movement, no use of accessory muscles.  Cardiac: RRR, no JVD Vascular:  Vessel Right Left  Radial Palpable Palpable                          PT Palpable Palpable  DP Palpable Palpable   Gastrointestinal: soft, non-tender/non-distended. No guarding/reflex.  Musculoskeletal: M/S 5/5 throughout.  No deformity or atrophy. 1+  LLE edema. Neurologic: Sensation grossly intact in extremities.  Symmetrical.  Speech is fluent.  Psychiatric: Judgment intact, Mood & affect appropriate for pt's clinical situation. Dermatologic: No rashes or ulcers noted.  No cellulitis or open wounds.      Labs No results found for this or any previous visit (from the past 2160 hour(s)).  Radiology No results found.  Assessment/Plan  DVT (deep venous thrombosis) (HCC) Her pain and swelling of the left leg is very concerning to her, so we did a duplex today showing no acute DVT in the left lower extremity with stable chronic findings similar to her previous study. Her current symptoms are likely postphlebitic in nature.  I would recommend she continue to wear compression socks and take the 2.5 mg twice daily of Eliquis.  Elevation, exercise, and compression remain the hallmarks of treatment for postherpetic symptoms.  Given her young age and her unprovoked DVT, continuing on 2.5 mg twice daily of Eliquis would be recommended.  I will plan to see her back in 6 months.  Post-phlebitic syndrome Her pain and swelling of the left leg is very concerning to her, so we did a duplex  today showing no acute DVT in the left lower extremity with stable chronic findings similar to her previous study. Her current symptoms are likely postphlebitic in nature.  I would recommend she continue to wear compression socks and take the 2.5 mg twice daily of Eliquis.  Elevation, exercise, and compression remain the hallmarks of treatment for postherpetic symptoms.  Given her young age and her unprovoked DVT, continuing on 2.5 mg twice daily of Eliquis would be recommended.  I will plan to see her back in 6 months.  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  She has been appropriately wearing compression socks for many months, elevating her legs, and exercising.  Despite these appropriate conservative therapies, she continues to have worrisome symptoms of her lymphedema and would clearly benefit from a lymphedema pump.    Festus Barren, MD  11/29/2022 1:33 PM    This note was created with Dragon medical transcription system.  Any errors from dictation are purely unintentional

## 2022-10-04 NOTE — Assessment & Plan Note (Signed)
Her pain and swelling of the left leg is very concerning to her, so we did a duplex today showing no acute DVT in the left lower extremity with stable chronic findings similar to her previous study. Her current symptoms are likely postphlebitic in nature.  I would recommend she continue to wear compression socks and take the 2.5 mg twice daily of Eliquis.  Elevation, exercise, and compression remain the hallmarks of treatment for postherpetic symptoms.  Given her young age and her unprovoked DVT, continuing on 2.5 mg twice daily of Eliquis would be recommended.  I will plan to see her back in 6 months. 

## 2022-11-07 ENCOUNTER — Ambulatory Visit (INDEPENDENT_AMBULATORY_CARE_PROVIDER_SITE_OTHER): Payer: Federal, State, Local not specified - PPO | Admitting: Vascular Surgery

## 2022-11-07 ENCOUNTER — Encounter (INDEPENDENT_AMBULATORY_CARE_PROVIDER_SITE_OTHER): Payer: Federal, State, Local not specified - PPO

## 2022-11-24 ENCOUNTER — Encounter (INDEPENDENT_AMBULATORY_CARE_PROVIDER_SITE_OTHER): Payer: Self-pay | Admitting: Vascular Surgery

## 2022-11-29 DIAGNOSIS — I890 Lymphedema, not elsewhere classified: Secondary | ICD-10-CM | POA: Insufficient documentation

## 2022-11-29 NOTE — Assessment & Plan Note (Signed)
The patient has developed lymphedema from chronic scarring lymphatic channels with her postphlebitic syndrome and previous DVT.  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  She has been appropriately wearing compression socks for many months, elevating her legs, and exercising.  Despite these appropriate conservative therapies, she continues to have worrisome symptoms of her lymphedema and would clearly benefit from a lymphedema pump.

## 2023-02-28 ENCOUNTER — Encounter (INDEPENDENT_AMBULATORY_CARE_PROVIDER_SITE_OTHER): Payer: Self-pay | Admitting: Vascular Surgery

## 2023-04-14 ENCOUNTER — Encounter (INDEPENDENT_AMBULATORY_CARE_PROVIDER_SITE_OTHER): Payer: Self-pay

## 2023-04-14 ENCOUNTER — Ambulatory Visit (INDEPENDENT_AMBULATORY_CARE_PROVIDER_SITE_OTHER): Payer: Federal, State, Local not specified - PPO | Admitting: Vascular Surgery

## 2023-05-02 IMAGING — US US EXTREM LOW VENOUS*L*
1 series · 13 of 24 positions shown · non-contrast
Comparison: None Available.

CLINICAL DATA: Left lower extremity pain and swelling

EXAM:
LEFT LOWER EXTREMITY VENOUS DOPPLER ULTRASOUND
TECHNIQUE: Gray-scale sonography with compression, as well as color and duplex
ultrasound, were performed to evaluate the deep venous system(s)
from the level of the common femoral vein through the popliteal and
proximal calf veins.

[Series 1: us venous img lower uni left (dvt) · portal-venous · 13 of 37 slices shown]
[im 1/37]
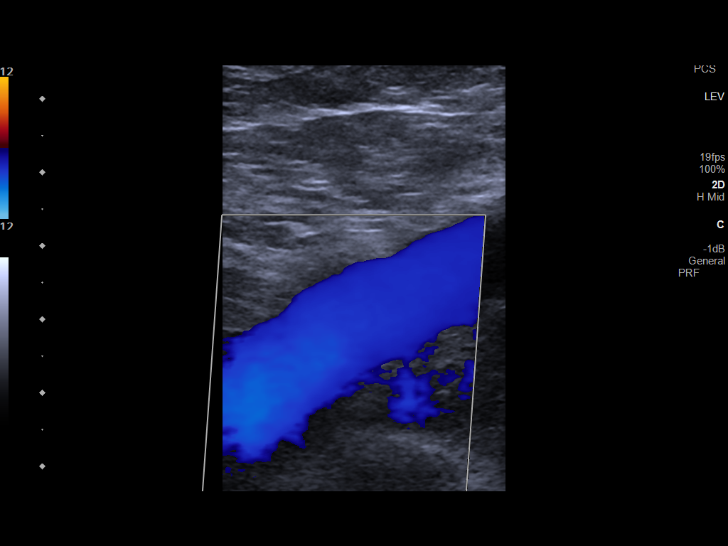
[im 4/37]
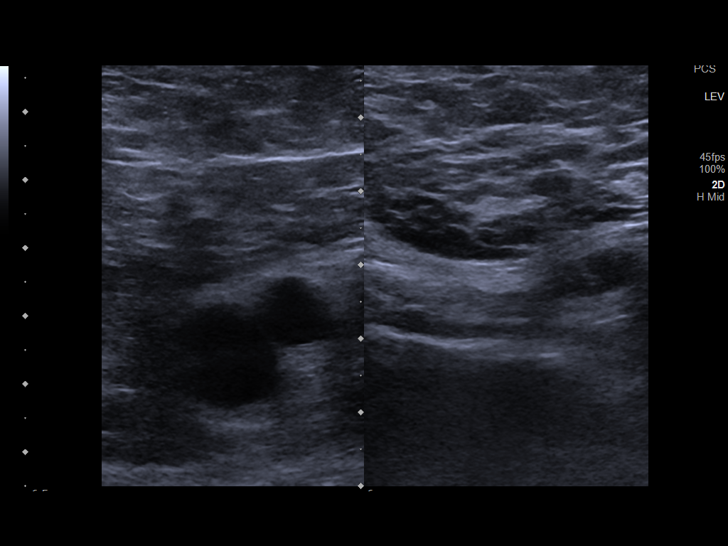
[im 7/37]
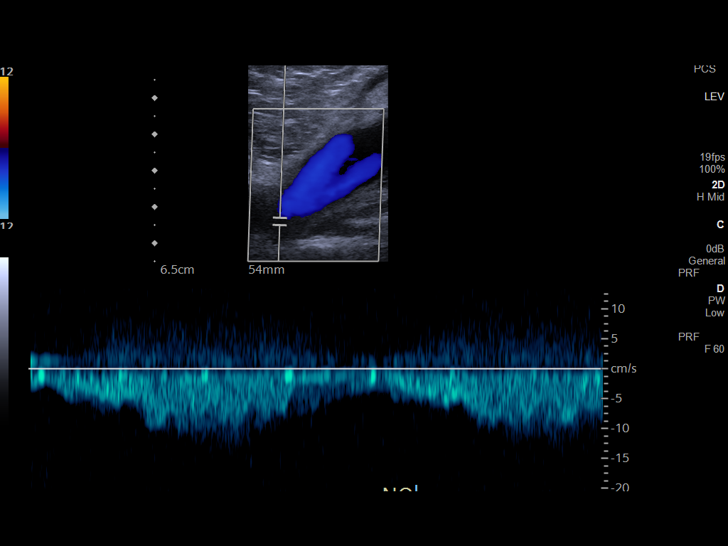
[im 10/37]
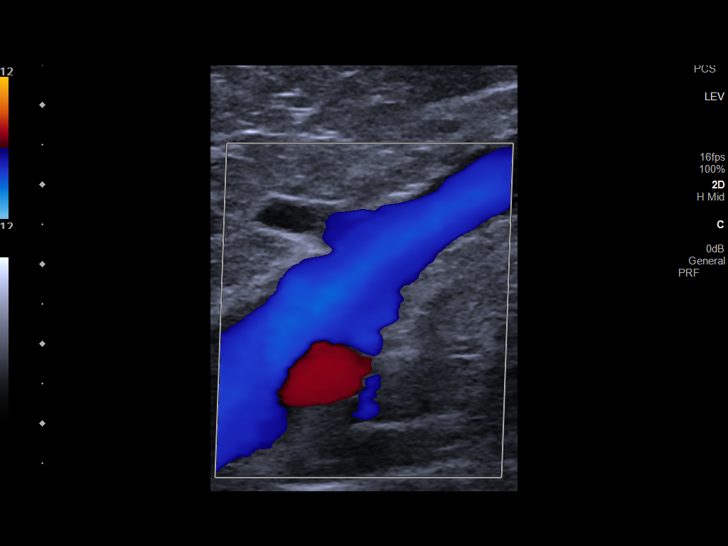
[im 13/37]
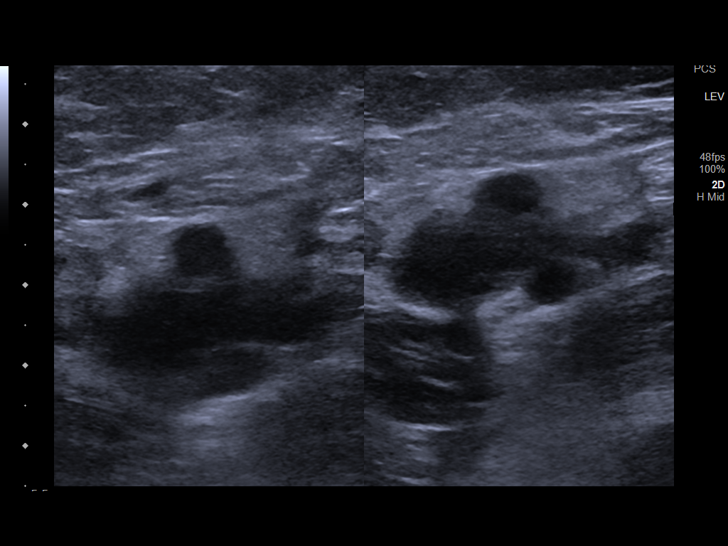
[im 16/37]
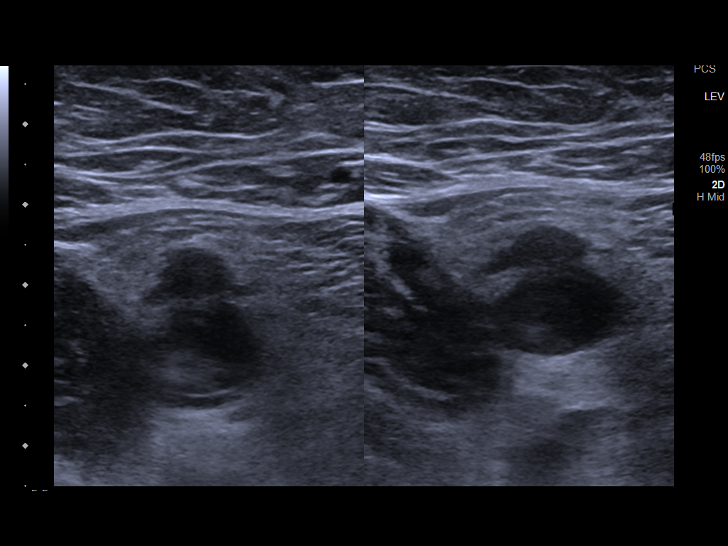
[im 19/37]
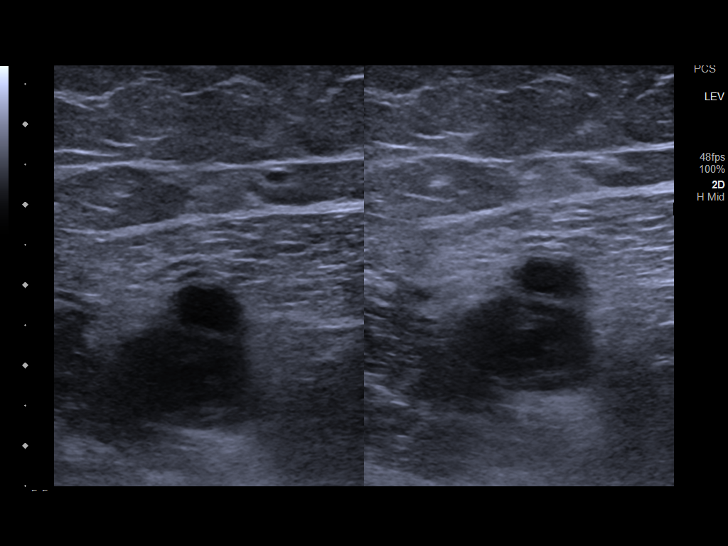
[im 21/37]
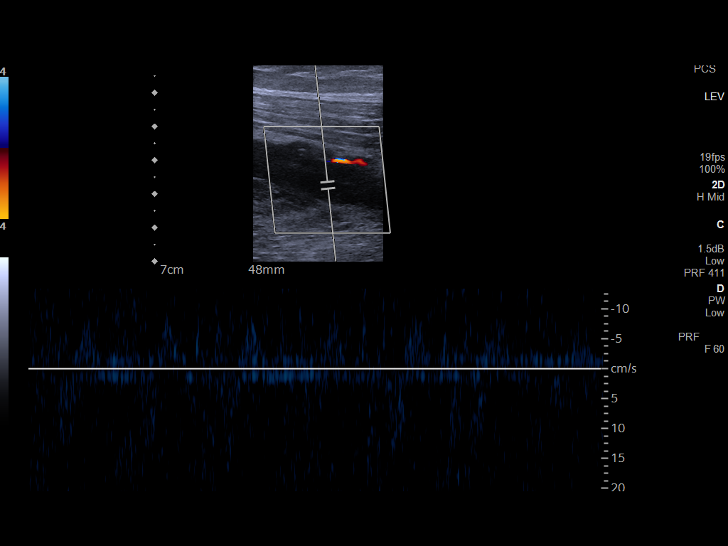
[im 24/37]
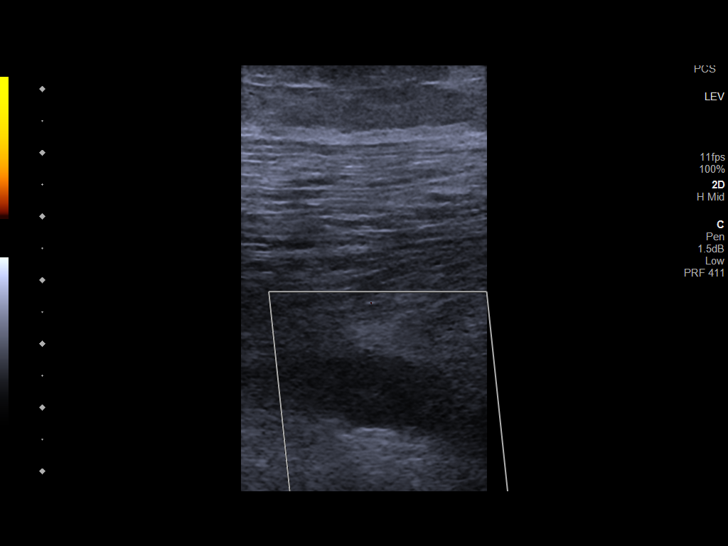
[im 27/37]
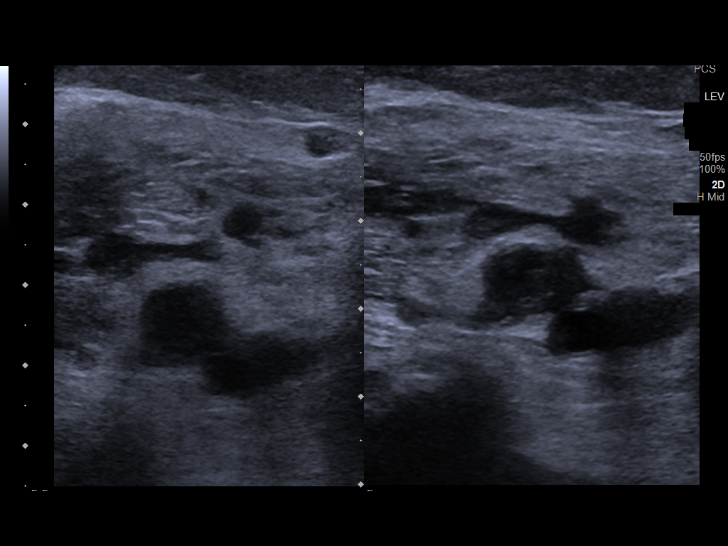
[im 30/37]
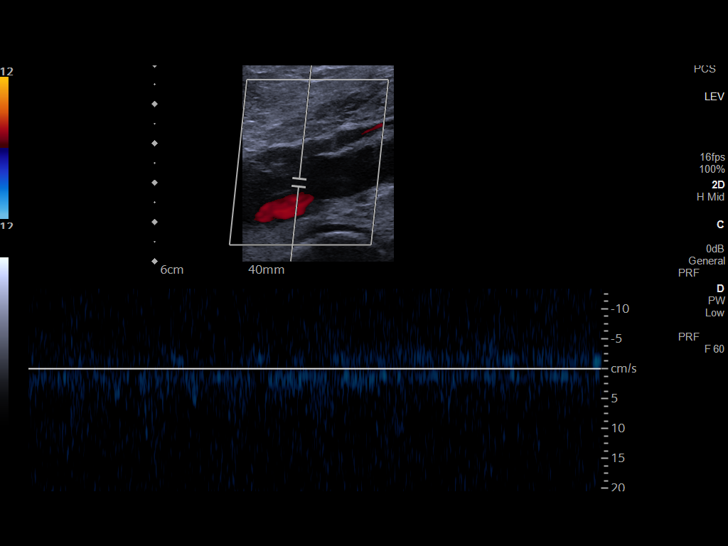
[im 33/37]
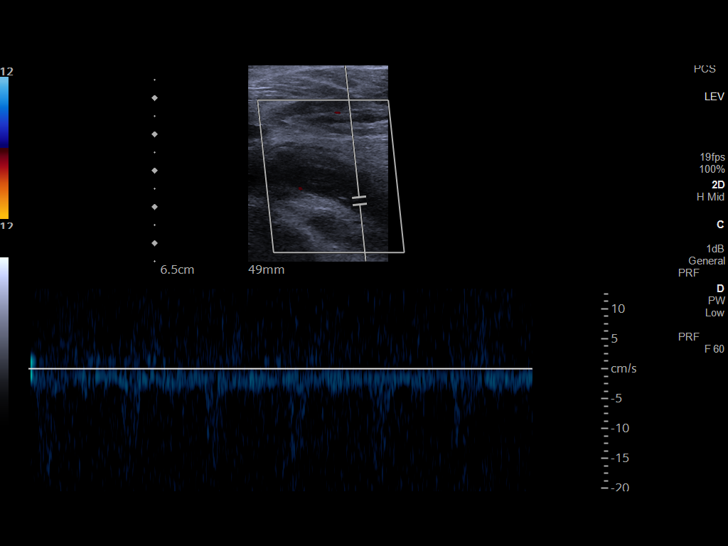
[im 37/37]
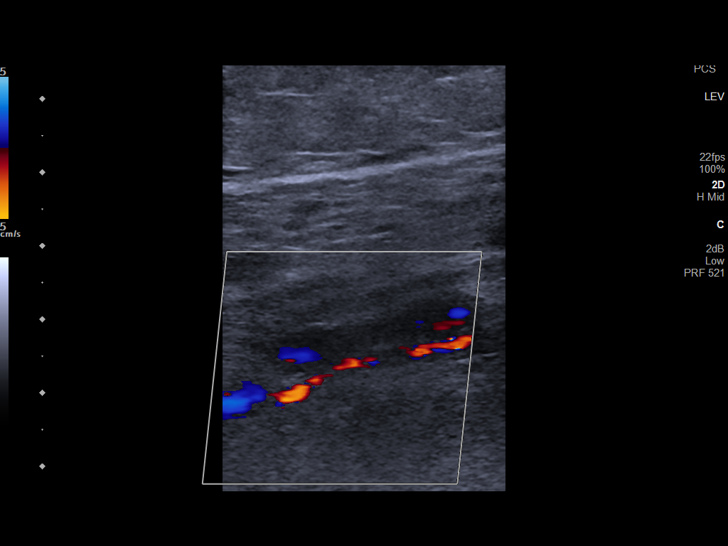

[13 of 24 positions shown; findings below may reference images not displayed]

FINDINGS: VENOUS

Positive examination for deep venous thrombosis in the left lower
extremity, with extensive thrombus present from the left common
femoral vein extending through the imaged portions of the calf
veins. Thrombus is occlusive along the length of the femoral vein,
popliteal vein, and profunda femoris.

Limited views of the contralateral common femoral vein are
unremarkable.

OTHER

None.

Limitations: none
IMPRESSION: 1. Positive examination for deep venous thrombosis in the left lower
extremity, with extensive thrombus present from the left common
femoral vein through the imaged portions of the calf veins. Thrombus
is occlusive along the length of the femoral vein, popliteal vein,
and profunda femoris.

2. Central extent of thrombus is not clearly established by
ultrasound, and possibly extends into the pelvis. Consider CT
venogram to further evaluate for central thrombus.

These results will be called to the ordering clinician or
representative by the Radiologist Assistant, and communication
documented in the PACS or [REDACTED].

## 2023-09-05 ENCOUNTER — Encounter (INDEPENDENT_AMBULATORY_CARE_PROVIDER_SITE_OTHER): Payer: Self-pay
